# Patient Record
Sex: Male | Born: 1955 | Race: White | Hispanic: No | Marital: Married | Smoking: Never smoker
Health system: Southern US, Community
[De-identification: ages and names within clinical notes are randomized; demographics above are authoritative.]

## PROBLEM LIST (undated history)

## (undated) DIAGNOSIS — M5136 Other intervertebral disc degeneration, lumbar region: Secondary | ICD-10-CM

## (undated) DIAGNOSIS — I10 Essential (primary) hypertension: Secondary | ICD-10-CM

## (undated) DIAGNOSIS — K219 Gastro-esophageal reflux disease without esophagitis: Secondary | ICD-10-CM

## (undated) DIAGNOSIS — K900 Celiac disease: Secondary | ICD-10-CM

## (undated) DIAGNOSIS — M51369 Other intervertebral disc degeneration, lumbar region without mention of lumbar back pain or lower extremity pain: Secondary | ICD-10-CM

## (undated) HISTORY — PX: OTHER SURGICAL HISTORY: SHX169

## (undated) HISTORY — PX: ANTERIOR CERVICAL DECOMP/DISCECTOMY FUSION: SHX1161

## (undated) HISTORY — DX: Other intervertebral disc degeneration, lumbar region without mention of lumbar back pain or lower extremity pain: M51.369

## (undated) HISTORY — DX: Celiac disease: K90.0

## (undated) HISTORY — DX: Other intervertebral disc degeneration, lumbar region: M51.36

## (undated) HISTORY — DX: Essential (primary) hypertension: I10

## (undated) HISTORY — DX: Gastro-esophageal reflux disease without esophagitis: K21.9

## (undated) HISTORY — PX: APPENDECTOMY: SHX54

---

## 2011-06-03 ENCOUNTER — Ambulatory Visit: Payer: Self-pay | Admitting: Physician Assistant

## 2011-06-14 ENCOUNTER — Encounter: Payer: Self-pay | Admitting: Physician Assistant

## 2011-06-14 ENCOUNTER — Ambulatory Visit (INDEPENDENT_AMBULATORY_CARE_PROVIDER_SITE_OTHER): Payer: BC Managed Care – PPO | Admitting: Physician Assistant

## 2011-06-14 VITALS — BP 151/90 | HR 75 | Temp 98.3°F | Ht 74.0 in | Wt 291.0 lb

## 2011-06-14 DIAGNOSIS — F064 Anxiety disorder due to known physiological condition: Secondary | ICD-10-CM | POA: Insufficient documentation

## 2011-06-14 DIAGNOSIS — F068 Other specified mental disorders due to known physiological condition: Secondary | ICD-10-CM

## 2011-06-14 DIAGNOSIS — G8929 Other chronic pain: Secondary | ICD-10-CM | POA: Insufficient documentation

## 2011-06-14 DIAGNOSIS — Z7689 Persons encountering health services in other specified circumstances: Secondary | ICD-10-CM

## 2011-06-14 DIAGNOSIS — K219 Gastro-esophageal reflux disease without esophagitis: Secondary | ICD-10-CM | POA: Insufficient documentation

## 2011-06-14 DIAGNOSIS — M5136 Other intervertebral disc degeneration, lumbar region: Secondary | ICD-10-CM | POA: Insufficient documentation

## 2011-06-14 DIAGNOSIS — M5137 Other intervertebral disc degeneration, lumbosacral region: Secondary | ICD-10-CM

## 2011-06-14 DIAGNOSIS — M545 Low back pain: Secondary | ICD-10-CM

## 2011-06-14 DIAGNOSIS — K900 Celiac disease: Secondary | ICD-10-CM | POA: Insufficient documentation

## 2011-06-14 DIAGNOSIS — I10 Essential (primary) hypertension: Secondary | ICD-10-CM | POA: Insufficient documentation

## 2011-06-14 MED ORDER — LISINOPRIL 20 MG PO TABS: 20.0000 mg | ORAL_TABLET | Freq: Every day | ORAL | Status: DC

## 2011-06-14 MED ORDER — MELOXICAM 7.5 MG PO TABS: 7.5000 mg | ORAL_TABLET | Freq: Every day | ORAL | Status: DC

## 2011-06-14 MED ORDER — OMEPRAZOLE 20 MG PO CPDR: 20.0000 mg | DELAYED_RELEASE_CAPSULE | Freq: Every day | ORAL | Status: DC

## 2011-06-14 MED ORDER — ESCITALOPRAM OXALATE 20 MG PO TABS: 20.0000 mg | ORAL_TABLET | Freq: Every day | ORAL | Status: DC

## 2011-06-14 NOTE — Patient Instructions (Signed)
Will refer to pain management. Refilled all medications. Follow up as needed.

## 2011-06-14 NOTE — Progress Notes (Signed)
  Subjective:    Patient ID: Kenneth Duncan, male    DOB: Jan 12, 1956, 56 y.o.   MRN: [id number]  HPI Patient presents to clinic to establish care. Patient's history was reviewed together and documented. Patient is doing well today. They have moved here from [place] due to job change. 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 He also takes daily mobic and lexapro to manage pain and anxiety. He also has celiac disease and takes dapsone to control.   Review of Systems     Objective:   Physical Exam  Constitutional: He is oriented to person, place, and time. He appears well-developed and well-nourished.  HENT:  Head: Normocephalic and atraumatic.  Cardiovascular: Normal rate, regular rhythm and normal heart sounds.   Pulmonary/Chest: Effort normal and breath sounds normal.  Musculoskeletal: Normal range of motion.  Neurological: He is alert and oriented to person, place, and time.  Psychiatric: He has a normal mood and affect. His behavior is normal.          Assessment & Plan:  Hypertension- Refilled Lisinopril. Rechecked blood pressure 146/98. Patient reports that he has blood pressure cuff and will start check blood pressure more frequently. He states that believes his blood pressure was higher today than it normally is.   Low back pain and degenerative changes- Will refer to pain management for epidural injections and regulation of other pain medications. Mobic 7.83m refilled. Patient's pain is well controlled today.  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  GERD-Omeprazole refilled. Patient doing well on this dose.

## 2011-06-23 ENCOUNTER — Other Ambulatory Visit: Payer: Self-pay | Admitting: *Deleted

## 2011-06-23 MED ORDER — DAPSONE 100 MG PO TABS: 100.0000 mg | ORAL_TABLET | Freq: Two times a day (BID) | ORAL | Status: DC

## 2011-06-23 MED ORDER — OMEPRAZOLE 20 MG PO CPDR: 20.0000 mg | DELAYED_RELEASE_CAPSULE | Freq: Every day | ORAL | Status: DC

## 2011-08-24 ENCOUNTER — Encounter: Payer: Self-pay | Admitting: Physician Assistant

## 2011-08-26 DIAGNOSIS — Z0289 Encounter for other administrative examinations: Secondary | ICD-10-CM

## 2011-09-29 ENCOUNTER — Telehealth: Payer: Self-pay | Admitting: *Deleted

## 2011-09-29 MED ORDER — LORATADINE 10 MG PO TABS: 10.0000 mg | ORAL_TABLET | Freq: Every day | ORAL | Status: DC

## 2011-09-29 NOTE — Telephone Encounter (Signed)
Yes, ok for laratidine 74m QD, #90 with 1 RF.

## 2011-09-29 NOTE — Telephone Encounter (Signed)
Medication sent to pharmacy  

## 2011-09-29 NOTE — Telephone Encounter (Signed)
Wife states husband is having some watery eyes and allergy issues and would like to know if we can send Loratidine for him to pharmacy like Luvenia Starch did for her. Please advise.

## 2011-11-21 ENCOUNTER — Ambulatory Visit: Payer: BC Managed Care – PPO | Admitting: Physician Assistant

## 2011-11-21 ENCOUNTER — Ambulatory Visit (INDEPENDENT_AMBULATORY_CARE_PROVIDER_SITE_OTHER): Payer: BC Managed Care – PPO | Admitting: Physician Assistant

## 2011-11-21 ENCOUNTER — Encounter: Payer: Self-pay | Admitting: Physician Assistant

## 2011-11-21 VITALS — BP 124/74 | HR 69 | Ht 74.0 in | Wt 289.0 lb

## 2011-11-21 DIAGNOSIS — R5383 Other fatigue: Secondary | ICD-10-CM

## 2011-11-21 DIAGNOSIS — B880 Other acariasis: Secondary | ICD-10-CM

## 2011-11-21 DIAGNOSIS — Z79899 Other long term (current) drug therapy: Secondary | ICD-10-CM

## 2011-11-21 DIAGNOSIS — R5381 Other malaise: Secondary | ICD-10-CM

## 2011-11-21 DIAGNOSIS — L130 Dermatitis herpetiformis: Secondary | ICD-10-CM

## 2011-11-21 DIAGNOSIS — Z8639 Personal history of other endocrine, nutritional and metabolic disease: Secondary | ICD-10-CM

## 2011-11-21 MED ORDER — TRIAMCINOLONE ACETONIDE 0.1 % EX CREA: TOPICAL_CREAM | Freq: Two times a day (BID) | CUTANEOUS | Status: DC

## 2011-11-21 MED ORDER — MUPIROCIN 2 % EX OINT: TOPICAL_OINTMENT | Freq: Two times a day (BID) | CUTANEOUS | Status: AC

## 2011-11-21 NOTE — Patient Instructions (Addendum)
Will give triamcinolone(Kenalog) cream twice a day for leg up two weeks.   Bactroban to use twice a day to use on head. Follow up in 2-3.

## 2011-11-22 LAB — CBC WITH DIFFERENTIAL/PLATELET
Basophils Absolute: 0 10*3/uL (ref 0.0–0.1)
Basophils Relative: 0 % (ref 0–1)
Hemoglobin: 12.8 g/dL — ABNORMAL LOW (ref 13.0–17.0)
MCHC: 31.7 g/dL (ref 30.0–36.0)
Neutro Abs: 5.9 10*3/uL (ref 1.7–7.7)
Neutrophils Relative %: 65 % (ref 43–77)
Platelets: 167 10*3/uL (ref 150–400)
RDW: 14.7 % (ref 11.5–15.5)

## 2011-11-22 LAB — VITAMIN B12: Vitamin B-12: 321 pg/mL (ref 211–911)

## 2011-11-22 NOTE — Progress Notes (Signed)
  Subjective:    Patient ID: Kenneth Duncan, male    DOB: Aug 13, 1955, 56 y.o.   MRN: [id number]  HPI Pt presents to clinic with rash on legs that started about 1 month ago. The rash started around right ankle and then moved up right leg. Rash is very itchy. He has tried nothing to make better and nothing makes worse. They do seem to look like they are getting better. Denies any fever, chills, burning, SOB. He does have Celiac disease and has dermatitis herpeticform as a result he does not feel rash on his lef is from celiac disease. He has not been strict on diet lately and could have injested some gluten. He also has a rash on his scalp that is also very itchy for the last month. Has not put anything on those lesion either. He does state that about 2 weeks ago did feel like scalp lesions were draining.   He also has started feeling more tired lately. He has a history of b12 deficiency and used to take b12 shots every other month. He has not had shots in many months.    Review of Systems     Objective:   Physical Exam  Constitutional: He is oriented to person, place, and time. He appears well-developed and well-nourished.  HENT:       Numerous crusted and some open lesion on the scalp that are erythematous. No active drainage but a hx of drainage.   Cardiovascular: Normal rate and normal heart sounds.   Pulmonary/Chest: Effort normal and breath sounds normal. He has no wheezes.  Neurological: He is alert and oriented to person, place, and time.  Skin: Skin is warm and dry.       Numerous macules with central pinpoint scabs and erythematous base on front of right leg and around ankle.  Psychiatric: He has a normal mood and affect. His behavior is normal.          Assessment & Plan:  Chiggers- Discussed what Chiggers are. Gave triamcinolone to use BID for 2 weeks. Call if not improving. Encouraged patient to use bug spray when working out doors.   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 Some lesions on scalp look infected so gave Bactroban BID for 2 weeks. If lesions do not improve or resolve we can biopsy to see exactly what lesions are.   Tired/hx of b12 defiency- will get CBC and b12 levels. May need to start b12 shots again. He was not able to take oral b12.

## 2011-11-23 ENCOUNTER — Other Ambulatory Visit: Payer: Self-pay | Admitting: Physician Assistant

## 2011-11-23 MED ORDER — CYANOCOBALAMIN 1000 MCG/ML IJ SOLN: 1000.0000 ug | Freq: Once | INTRAMUSCULAR | Status: AC

## 2011-12-06 ENCOUNTER — Other Ambulatory Visit: Payer: Self-pay | Admitting: *Deleted

## 2011-12-06 MED ORDER — LISINOPRIL 20 MG PO TABS: 20.0000 mg | ORAL_TABLET | Freq: Every day | ORAL | Status: DC

## 2011-12-06 MED ORDER — OMEPRAZOLE 20 MG PO CPDR: 20.0000 mg | DELAYED_RELEASE_CAPSULE | Freq: Every day | ORAL | Status: DC

## 2011-12-06 MED ORDER — ESCITALOPRAM OXALATE 20 MG PO TABS: 20.0000 mg | ORAL_TABLET | Freq: Every day | ORAL | Status: DC

## 2011-12-08 ENCOUNTER — Other Ambulatory Visit: Payer: Self-pay | Admitting: Physician Assistant

## 2012-03-14 ENCOUNTER — Other Ambulatory Visit: Payer: Self-pay | Admitting: Physician Assistant

## 2012-03-26 ENCOUNTER — Other Ambulatory Visit: Payer: Self-pay | Admitting: *Deleted

## 2012-03-26 MED ORDER — OMEPRAZOLE 20 MG PO CPDR: 20.0000 mg | DELAYED_RELEASE_CAPSULE | Freq: Every day | ORAL | Status: DC

## 2012-04-23 ENCOUNTER — Other Ambulatory Visit: Payer: Self-pay

## 2012-04-23 MED ORDER — DAPSONE 100 MG PO TABS: 100.0000 mg | ORAL_TABLET | Freq: Two times a day (BID) | ORAL | Status: DC

## 2012-04-23 MED ORDER — LISINOPRIL 20 MG PO TABS: 20.0000 mg | ORAL_TABLET | Freq: Every day | ORAL | Status: DC

## 2012-07-19 ENCOUNTER — Other Ambulatory Visit: Payer: Self-pay | Admitting: Family Medicine

## 2012-07-27 ENCOUNTER — Ambulatory Visit (INDEPENDENT_AMBULATORY_CARE_PROVIDER_SITE_OTHER): Payer: BC Managed Care – PPO | Admitting: Physician Assistant

## 2012-07-27 ENCOUNTER — Encounter: Payer: Self-pay | Admitting: Physician Assistant

## 2012-07-27 VITALS — BP 121/85 | HR 84 | Wt 281.0 lb

## 2012-07-27 DIAGNOSIS — L130 Dermatitis herpetiformis: Secondary | ICD-10-CM

## 2012-07-27 DIAGNOSIS — K219 Gastro-esophageal reflux disease without esophagitis: Secondary | ICD-10-CM

## 2012-07-27 DIAGNOSIS — M51379 Other intervertebral disc degeneration, lumbosacral region without mention of lumbar back pain or lower extremity pain: Secondary | ICD-10-CM

## 2012-07-27 DIAGNOSIS — E538 Deficiency of other specified B group vitamins: Secondary | ICD-10-CM

## 2012-07-27 DIAGNOSIS — Z79899 Other long term (current) drug therapy: Secondary | ICD-10-CM

## 2012-07-27 DIAGNOSIS — M5137 Other intervertebral disc degeneration, lumbosacral region: Secondary | ICD-10-CM

## 2012-07-27 DIAGNOSIS — Z13220 Encounter for screening for lipoid disorders: Secondary | ICD-10-CM

## 2012-07-27 DIAGNOSIS — I10 Essential (primary) hypertension: Secondary | ICD-10-CM

## 2012-07-27 DIAGNOSIS — M5136 Other intervertebral disc degeneration, lumbar region: Secondary | ICD-10-CM

## 2012-07-27 DIAGNOSIS — F064 Anxiety disorder due to known physiological condition: Secondary | ICD-10-CM

## 2012-07-27 DIAGNOSIS — Z131 Encounter for screening for diabetes mellitus: Secondary | ICD-10-CM

## 2012-07-27 MED ORDER — ESCITALOPRAM OXALATE 20 MG PO TABS: 20.0000 mg | ORAL_TABLET | Freq: Every day | ORAL | Status: DC

## 2012-07-27 MED ORDER — LISINOPRIL 20 MG PO TABS: 20.0000 mg | ORAL_TABLET | Freq: Every day | ORAL | Status: DC

## 2012-07-27 MED ORDER — TRIAMCINOLONE ACETONIDE 0.1 % EX CREA: TOPICAL_CREAM | Freq: Two times a day (BID) | CUTANEOUS | Status: DC

## 2012-07-27 MED ORDER — OMEPRAZOLE 20 MG PO CPDR: 20.0000 mg | DELAYED_RELEASE_CAPSULE | Freq: Every day | ORAL | Status: DC

## 2012-07-27 MED ORDER — CYANOCOBALAMIN 1000 MCG/ML IJ SOLN
1000.0000 ug | Freq: Once | INTRAMUSCULAR | Status: AC
  Administered 2012-07-27: 1000 ug via INTRAMUSCULAR

## 2012-07-27 MED ORDER — FLUTICASONE PROPIONATE 50 MCG/ACT NA SUSP: 1.0000 | Freq: Every day | NASAL | Status: DC

## 2012-07-27 MED ORDER — DAPSONE 100 MG PO TABS: 100.0000 mg | ORAL_TABLET | Freq: Two times a day (BID) | ORAL | Status: DC

## 2012-07-27 MED ORDER — MELOXICAM 7.5 MG PO TABS: 7.5000 mg | ORAL_TABLET | Freq: Every day | ORAL | Status: DC

## 2012-07-27 MED ORDER — LORATADINE 10 MG PO TABS: 10.0000 mg | ORAL_TABLET | Freq: Every day | ORAL | Status: DC

## 2012-07-28 DIAGNOSIS — E538 Deficiency of other specified B group vitamins: Secondary | ICD-10-CM | POA: Insufficient documentation

## 2012-07-28 DIAGNOSIS — L130 Dermatitis herpetiformis: Secondary | ICD-10-CM | POA: Insufficient documentation

## 2012-07-28 NOTE — Progress Notes (Signed)
  Subjective:    Patient ID: Kenneth Duncan, male    DOB: April 06, 1956, 57 y.o.   MRN: [id number]  HPI Patient presents to the clinic to get medication refills.   HTN- controlled taking lisinopril daily. Denies any CP, palpitations, HA's. Keeping a low salt diet he has lost 10lbs over the past year. He feels great.   DDD- Mobic as needed along with as needed injections by pain clinic. Controlled today.   Gerd- controlled with prilosec. Diet affects reflux to the better and worse.  Celiac disease- controlled dapsone helps with herpetiform dermatitis. He also uses kenolog cream as needed for any rash.    b12 deficency needs shot today. Normally gets by nurse at work but needs today.   Allergies- eyes continue to water and runny nose if he does not medication. Needs refill of claritin/flonase.  Depression- well controlled. Feels happy. Exercising. Work going well. lexapro daily.    Review of Systems  All other systems reviewed and are negative.       Objective:   Physical Exam  Constitutional: He is oriented to person, place, and time. He appears well-developed and well-nourished.  HENT:  Head: Normocephalic and atraumatic.  Eyes: Conjunctivae are normal.  Neck: Normal range of motion. Neck supple. No thyromegaly present.  Cardiovascular: Normal rate, regular rhythm and normal heart sounds.   Pulmonary/Chest: Effort normal and breath sounds normal. He has no wheezes.  Lymphadenopathy:    He has no cervical adenopathy.  Neurological: He is alert and oriented to person, place, and time.  Skin: Skin is warm and dry.  Small erythematous papules with scabs on top over both arms.   Psychiatric: He has a normal mood and affect. His behavior is normal.          Assessment & Plan:  HtN- refilled lisionpril. Reminded of low salt diet and exercise.   Depression/anxiety- Lexapro refilled.  GERD- refilled prilosec.  DDD- refilled mobic.   Allergies- refilled  flonase/claritin.  b12 deficency- gave B12 shot today.   Celiac disease/herpetic dermatitis- refilled dapsone and kenalog.   Needs fasting labs and to medication management check liver/kidney.

## 2012-07-31 ENCOUNTER — Other Ambulatory Visit: Payer: Self-pay | Admitting: *Deleted

## 2012-07-31 MED ORDER — FLUTICASONE PROPIONATE 50 MCG/ACT NA SUSP: 1.0000 | Freq: Every day | NASAL | Status: DC

## 2012-07-31 NOTE — Telephone Encounter (Signed)
[facility]

## 2012-09-19 ENCOUNTER — Other Ambulatory Visit: Payer: Self-pay | Admitting: Physician Assistant

## 2013-01-17 ENCOUNTER — Other Ambulatory Visit: Payer: Self-pay | Admitting: Physician Assistant

## 2013-02-19 ENCOUNTER — Other Ambulatory Visit: Payer: Self-pay | Admitting: *Deleted

## 2013-02-19 MED ORDER — ESCITALOPRAM OXALATE 20 MG PO TABS: 20.0000 mg | ORAL_TABLET | Freq: Every day | ORAL | Status: DC

## 2013-03-13 ENCOUNTER — Encounter: Payer: Self-pay | Admitting: Physician Assistant

## 2013-03-13 ENCOUNTER — Ambulatory Visit (INDEPENDENT_AMBULATORY_CARE_PROVIDER_SITE_OTHER): Payer: BC Managed Care – PPO | Admitting: Physician Assistant

## 2013-03-13 VITALS — BP 129/77 | HR 71 | Wt 293.0 lb

## 2013-03-13 DIAGNOSIS — L130 Dermatitis herpetiformis: Secondary | ICD-10-CM

## 2013-03-13 DIAGNOSIS — Q828 Other specified congenital malformations of skin: Secondary | ICD-10-CM

## 2013-03-13 DIAGNOSIS — L909 Atrophic disorder of skin, unspecified: Secondary | ICD-10-CM

## 2013-03-13 DIAGNOSIS — R5383 Other fatigue: Secondary | ICD-10-CM

## 2013-03-13 DIAGNOSIS — R05 Cough: Secondary | ICD-10-CM

## 2013-03-13 DIAGNOSIS — J209 Acute bronchitis, unspecified: Secondary | ICD-10-CM

## 2013-03-13 DIAGNOSIS — N529 Male erectile dysfunction, unspecified: Secondary | ICD-10-CM

## 2013-03-13 DIAGNOSIS — R5381 Other malaise: Secondary | ICD-10-CM

## 2013-03-13 MED ORDER — ESCITALOPRAM OXALATE 20 MG PO TABS: 20.0000 mg | ORAL_TABLET | Freq: Every day | ORAL | Status: DC

## 2013-03-13 MED ORDER — CYANOCOBALAMIN 1000 MCG/ML IJ SOLN: 1000.0000 ug | Freq: Once | INTRAMUSCULAR | Status: DC

## 2013-03-13 MED ORDER — AZITHROMYCIN 250 MG PO TABS: ORAL_TABLET | ORAL | Status: DC

## 2013-03-13 MED ORDER — SILDENAFIL CITRATE 50 MG PO TABS: 50.0000 mg | ORAL_TABLET | Freq: Every day | ORAL | Status: DC | PRN

## 2013-03-13 MED ORDER — HYDROCODONE-HOMATROPINE 5-1.5 MG/5ML PO SYRP: 5.0000 mL | ORAL_SOLUTION | Freq: Every evening | ORAL | Status: DC | PRN

## 2013-03-13 MED ORDER — TRIAMCINOLONE ACETONIDE 0.1 % EX CREA: TOPICAL_CREAM | Freq: Two times a day (BID) | CUTANEOUS | Status: DC

## 2013-03-13 NOTE — Patient Instructions (Signed)
Acute Bronchitis Bronchitis is inflammation of the airways that extend from the windpipe into the lungs (bronchi). The inflammation often causes mucus to develop. This leads to a cough, which is the most common symptom of bronchitis.  In acute bronchitis, the condition usually develops suddenly and goes away over time, usually in a couple weeks. Smoking, allergies, and asthma can make bronchitis worse. Repeated episodes of bronchitis may cause further lung problems.  CAUSES Acute bronchitis is most often caused by the same virus that causes a cold. The virus can spread from person to person (contagious).  SIGNS AND SYMPTOMS   Cough.   Fever.   Coughing up mucus.   Body aches.   Chest congestion.   Chills.   Shortness of breath.   Sore throat.  DIAGNOSIS  Acute bronchitis is usually diagnosed through a physical exam. Tests, such as chest X-rays, are sometimes done to rule out other conditions.  TREATMENT  Acute bronchitis usually goes away in a couple weeks. Often times, no medical treatment is necessary. Medicines are sometimes given for relief of fever or cough. Antibiotics are usually not needed but may be prescribed in certain situations. In some cases, an inhaler may be recommended to help reduce shortness of breath and control the cough. A cool mist vaporizer may also be used to help thin bronchial secretions and make it easier to clear the chest.  HOME CARE INSTRUCTIONS  Get plenty of rest.   Drink enough fluids to keep your urine clear or pale yellow (unless you have a medical condition that requires fluid restriction). Increasing fluids may help thin your secretions and will prevent dehydration.   Only take over-the-counter or prescription medicines as directed by your health care provider.   Avoid smoking and secondhand smoke. Exposure to cigarette smoke or irritating chemicals will make bronchitis worse. If you are a smoker, consider using nicotine gum or skin  patches to help control withdrawal symptoms. Quitting smoking will help your lungs heal faster.   Reduce the chances of another bout of acute bronchitis by washing your hands frequently, avoiding people with cold symptoms, and trying not to touch your hands to your mouth, nose, or eyes.   Follow up with your health care provider as directed.  SEEK MEDICAL CARE IF: Your symptoms do not improve after 1 week of treatment.  SEEK IMMEDIATE MEDICAL CARE IF:  You develop an increased fever or chills.   You have chest pain.   You have severe shortness of breath.  You have bloody sputum.   You develop dehydration.  You develop fainting.  You develop repeated vomiting.  You develop a severe headache. MAKE SURE YOU:   Understand these instructions.  Will watch your condition.  Will get help right away if you are not doing well or get worse. Document Released: 05/12/2004 Document Revised: 12/05/2012 Document Reviewed: 09/25/2012 [employer] Patient Information 2014 [employer].  

## 2013-03-13 NOTE — Progress Notes (Signed)
Subjective:    Patient ID: Kenneth Duncan, male    DOB: 01/20/1956, 57 y.o.   MRN: [id number]  HPI Patient is a 57 year old male who presents to the clinic with multiple concerns.  Patient would like skin tags removed today. They have bothered him for quite some time now. They get very irritated and call on clothes.   Patient has also had a lingering cough for the last 2 weeks. Cough has been mostly dry with little production. He denies any sinus pressure, ear pain, sore throat, shortness of breath or wheezing. He is trying many over-the-counter treatments such as Mucinex, Sudafed, NyQuil and nasal spray for couple days. He does feel like he is getting some better. He has coughed so much that his ribs ache. He denies any fever, chills nausea or vomiting.  Patient is also having erectile dysfunction. He is having problems getting it up and staying up. He denies any urinary symptoms. He denies any weak stream. He reports he did have a PSA done approximately 2 years ago and any digital rectal that was normal. 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 Patient has never tried anything for ED.  Anxiety-Lexapro is controlling his anxiety very well. He needs a refill today.  Dermatitis from celaic- controlled with And Triamcinolone. Patient Needs Refill of Triamcinolone Today.  B12 deficiency-patient needs refill on solution. He has not been doing every month and wonders if this could be contributing to his fatigue.     Review of Systems     Objective:   Physical Exam  Constitutional: He is oriented to person, place, and time. He appears well-developed and well-nourished.  HENT:  Head: Normocephalic and atraumatic.  Right Ear: External ear normal.  Left Ear: External ear normal.  Nose: Nose normal.  Mouth/Throat: Oropharynx is clear and moist.  Eyes: Conjunctivae are normal.  Neck: Normal range of motion. Neck supple.  Cardiovascular: Normal rate, regular rhythm and normal  heart sounds.   Pulmonary/Chest: Effort normal and breath sounds normal. He has no wheezes.  Dry cough on exam today.   Lymphadenopathy:    He has no cervical adenopathy.  Neurological: He is alert and oriented to person, place, and time.  Skin:  Multiple skin tags in bilateral axilla.   Psychiatric: He has a normal mood and affect. His behavior is normal.          Assessment & Plan:  accessory skin tags-   Skin Tag Removal Procedure Note  Pre-operative Diagnosis: Classic skin tags (acrochordon)  Post-operative Diagnosis: Classic skin tags (acrochordon)  Locations:bilateral  wrist and axilla.  Indications: irritation/getting caught in things  Anesthesia: ethyl chloride   Procedure Details  The risks (including bleeding and infection) and benefits of the procedure and Verbal informed consent obtained. Using sterile iris scissors, multiple skin tags were snipped off at their bases after cleansing with Betadine.  Bleeding was controlled by pressure.   Findings: Pathognomonic benign lesions  not sent for pathological exam.  Condition: Stable  Complications: none.  Plan: 1. Instructed to keep the wounds dry and covered for 24-48h and clean thereafter. 2. Warning signs of infection were reviewed.   3. Recommended that the patient use OTC acetaminophen as needed for pain.  4. Return as needed.  Acute bronchitis- patient reports to me some better and I do think this is a virus running its course. I did give Hycodan to use at bedtime. Encouraged patient to continue symptomatic care. I did  print off her prescription for azithromycin if he should return for worse over the holiday weekend.  Anxiety- Refilled Lexapro for 6 months today.  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  B12 deficiency-refilled B12 solution today.  erectile dysfunction- gave samples of viagra to try. Sent rx to pharmacy. Discussed he is not on any medications that could be causing.  Will check Testosterone. Pt sees urologist in past no BPH. Suggest CPe with prostate check in near future.

## 2013-03-15 LAB — TESTOSTERONE, FREE, TOTAL, SHBG
Sex Hormone Binding: 29 nmol/L (ref 13–71)
Testosterone, Free: 69.4 pg/mL (ref 47.0–244.0)

## 2013-03-18 ENCOUNTER — Telehealth: Payer: Self-pay | Admitting: *Deleted

## 2013-03-18 NOTE — Telephone Encounter (Signed)
Ok to send for 6 months Lisinopril/Prilosec. The only other cream I see that I have ever sent was bactroban. Is this what he wants? If not call pharmacy and have them send me all creams/gels so we can find out which one works.

## 2013-03-18 NOTE — Telephone Encounter (Signed)
Wife states wrong cream was called in to [facility]. States the cream you sent in was what he has at home that doesn't work but didn't know the name of the cream. Also requesting refill on Pril[facility]c and [facility] but he wasn't seen for HTN. Please advise.  Oscar La, LPN

## 2013-03-19 MED ORDER — MUPIROCIN 2 % EX OINT: TOPICAL_OINTMENT | Freq: Two times a day (BID) | CUTANEOUS | Status: DC

## 2013-03-19 MED ORDER — LISINOPRIL 20 MG PO TABS: ORAL_TABLET | ORAL | Status: DC

## 2013-03-19 MED ORDER — OMEPRAZOLE 20 MG PO CPDR: 20.0000 mg | DELAYED_RELEASE_CAPSULE | Freq: Every day | ORAL | Status: DC

## 2013-03-19 NOTE — Telephone Encounter (Signed)
Kenneth Duncan states we can send the Bactroban to [facility] for this pt. They are asking for a larger tube. What dose do we need to send.  Oscar La, LPN

## 2013-03-19 NOTE — Telephone Encounter (Signed)
[facility]

## 2013-04-19 ENCOUNTER — Other Ambulatory Visit: Payer: Self-pay | Admitting: *Deleted

## 2013-04-19 MED ORDER — OSELTAMIVIR PHOSPHATE 75 MG PO CAPS: 75.0000 mg | ORAL_CAPSULE | Freq: Two times a day (BID) | ORAL | Status: DC

## 2013-06-07 ENCOUNTER — Other Ambulatory Visit: Payer: Self-pay | Admitting: Physician Assistant

## 2013-08-05 ENCOUNTER — Ambulatory Visit (INDEPENDENT_AMBULATORY_CARE_PROVIDER_SITE_OTHER): Payer: BC Managed Care – PPO | Admitting: Family Medicine

## 2013-08-05 ENCOUNTER — Encounter: Payer: Self-pay | Admitting: Family Medicine

## 2013-08-05 VITALS — BP 136/96 | HR 93 | Wt 289.0 lb

## 2013-08-05 DIAGNOSIS — F411 Generalized anxiety disorder: Secondary | ICD-10-CM

## 2013-08-05 DIAGNOSIS — K644 Residual hemorrhoidal skin tags: Secondary | ICD-10-CM

## 2013-08-05 DIAGNOSIS — F419 Anxiety disorder, unspecified: Secondary | ICD-10-CM

## 2013-08-05 MED ORDER — HYDROCORTISONE ACE-PRAMOXINE 1-1 % RE FOAM: 1.0000 | Freq: Two times a day (BID) | RECTAL | Status: DC

## 2013-08-05 MED ORDER — ESCITALOPRAM OXALATE 20 MG PO TABS: 20.0000 mg | ORAL_TABLET | Freq: Every day | ORAL | Status: DC

## 2013-08-05 NOTE — Progress Notes (Signed)
CC: Kenneth Duncan is a 58 y.o. male is here for Hemorrhoids   Subjective: HPI:  Complains of rectal itching and mild swelling has been present for the last 4 weeks comes and goes on a daily basis. Feels like prior episodes with hemorrhoids. No interventions as of yet.  Has not been accompanied by any constipation or diarrhea nor any rectal bleeding or melena. Symptoms are improved with compressing the mass with his finger however this resolves symptoms only for matter of seconds. Denies rectal pain. Symptoms overall are mild in severity.  Requesting refills on Lexapro. He continues to take this on a nightly basis for anxiety that had been interfering with his sleep and quality of life. He states that he has no anxiety in his life right now nor difficulty sleeping. Denies thoughts of wanting to harm himself or others recently or remotely   Review Of Systems Outlined In HPI  Past Medical History  Diagnosis Date  . Hypertension   . Celiac disease   . Degenerative disc disease, lumbar   . GERD (gastroesophageal reflux disease)     Past Surgical History  Procedure Laterality Date  . Appendectomy    . Anterior cervical decomp/discectomy fusion    . Knee arthoscopic surgery       Bilateral menicus tear  . Clavicle      distal end removed.   Family History  Problem Relation Age of Onset  . Uterine cancer Mother   . Heart attack Mother 41  . Heart attack Father   . Diabetes Father   . Heart disease Father   . Diabetes Sister   . Heart disease Sister   . Heart attack Sister   . Lung cancer Brother   . Lymphoma Brother   . Uterine cancer Sister     History   Social History  . Marital Status: Married    Spouse Name: N/A    Number of Children: N/A  . Years of Education: N/A   Occupational History  . Not on file.   Social History Main Topics  . Smoking status: Never Smoker   . Smokeless tobacco: Not on file  . Alcohol Use: No  . Drug Use: No  . Sexual Activity: Yes   Partners: Female   Other Topics Concern  . Not on file   Social History Narrative  . No narrative on file     Objective: BP 136/96  Pulse 93  Wt 289 lb (131.09 kg)  General: Alert and Oriented, No Acute Distress HEENT: Pupils equal, round, reactive to light. Conjunctivae clear.  Moist membranes pharynx unremarkable Lungs: Clear and comfortable work of breathing Cardiac: Regular rate and rhythm. Normal S1/S2.  No murmurs, rubs, nor gallops.   Abdomen: Obese and soft Rectal: Single slightly inflamed nonthrombosed external hemorrhoid without evidence of bleeding right lateral aspect of rectum.  Mental Status: No depression, anxiety, nor agitation. Skin: Warm and dry.  Assessment & Plan: Kenneth Duncan was seen today for hemorrhoids.  Diagnoses and associated orders for this visit:  External prolapsed hemorrhoids - hydrocortisone-pramoxine (PROCTOFOAM HC) rectal foam; Place 1 applicator rectally 2 (two) times daily.  Anxiety - escitalopram (LEXAPRO) 20 MG tablet; Take 1 tablet (20 mg total) by mouth daily.    External hemorrhoid: Start psyllium powder 2 heaping teaspoons every evening for the next 3 weeks. Sitz baths for 15-20 minutes at a time 3-4 times a day as symptoms persist. Start ProctoFoam but he can stop this when symptoms have resolved. Anxiety: Controlled refilling  Lexapro   Return if symptoms worsen or fail to improve.

## 2013-08-06 ENCOUNTER — Other Ambulatory Visit: Payer: Self-pay | Admitting: Physician Assistant

## 2013-08-13 ENCOUNTER — Telehealth: Payer: Self-pay | Admitting: *Deleted

## 2013-08-13 DIAGNOSIS — K644 Residual hemorrhoidal skin tags: Secondary | ICD-10-CM

## 2013-08-13 NOTE — Telephone Encounter (Signed)
Urgent referral to GI specialist placed, please let me know if not contacted about this by late this week.

## 2013-08-13 NOTE — Telephone Encounter (Signed)
Wife calls and states that husbands hemorroids are not any better.  The proctofoam that he was given is not helping at all.  Wants to know if there is anything else he can do and maybe getting into see a Psychologist, sport and exercise. Clemetine Marker, LPN

## 2013-08-14 ENCOUNTER — Other Ambulatory Visit: Payer: Self-pay | Admitting: Physician Assistant

## 2013-08-14 ENCOUNTER — Telehealth: Payer: Self-pay | Admitting: *Deleted

## 2013-08-14 NOTE — Telephone Encounter (Signed)
Pt's wife called and states she was not happy with her husbands visit today because the doctor put a band around one hemrrhoid and told him to come back for the others. Wife wasn't happy that the doctor wanted him to have a colonoscopy on Friday. She states right now it is too exprensive. 6 months from now he may do it but not at this time.

## 2013-08-14 NOTE — Telephone Encounter (Signed)
I understand if cannot afford at this time. Hopefully you can discuss that you would like to get external hemorrhoids resolved before getting colonoscopy. If they do not understand then we most certainly can get a second opinion. Likely they only did one hemorrhoid to see results before they treated all of them.

## 2013-08-14 NOTE — Telephone Encounter (Addendum)
Wife notified and pt is already on his way to appt

## 2013-08-15 ENCOUNTER — Encounter: Payer: Self-pay | Admitting: Family Medicine

## 2013-08-15 DIAGNOSIS — K648 Other hemorrhoids: Secondary | ICD-10-CM | POA: Insufficient documentation

## 2013-08-15 NOTE — Telephone Encounter (Signed)
It was discussed with them at the time of the appt and the doctor wanted to schedule one since he hadnt had one. i told the pt the wife that that was their choice to not have colonoscopy done until they could financially better afford it

## 2013-11-13 ENCOUNTER — Other Ambulatory Visit: Payer: Self-pay | Admitting: Physician Assistant

## 2014-02-04 ENCOUNTER — Other Ambulatory Visit: Payer: Self-pay | Admitting: Physician Assistant

## 2014-02-11 ENCOUNTER — Other Ambulatory Visit: Payer: Self-pay | Admitting: *Deleted

## 2014-02-11 MED ORDER — LISINOPRIL 20 MG PO TABS: ORAL_TABLET | ORAL | Status: DC

## 2014-02-28 ENCOUNTER — Ambulatory Visit (INDEPENDENT_AMBULATORY_CARE_PROVIDER_SITE_OTHER): Payer: BC Managed Care – PPO | Admitting: Physician Assistant

## 2014-02-28 ENCOUNTER — Encounter: Payer: Self-pay | Admitting: Physician Assistant

## 2014-02-28 VITALS — BP 125/71 | HR 87 | Ht 74.0 in | Wt 296.0 lb

## 2014-02-28 DIAGNOSIS — R5383 Other fatigue: Secondary | ICD-10-CM | POA: Diagnosis not present

## 2014-02-28 DIAGNOSIS — L130 Dermatitis herpetiformis: Secondary | ICD-10-CM | POA: Diagnosis not present

## 2014-02-28 DIAGNOSIS — I10 Essential (primary) hypertension: Secondary | ICD-10-CM

## 2014-02-28 DIAGNOSIS — F419 Anxiety disorder, unspecified: Secondary | ICD-10-CM

## 2014-02-28 MED ORDER — DAPSONE 100 MG PO TABS: ORAL_TABLET | ORAL | Status: DC

## 2014-02-28 MED ORDER — FLUTICASONE PROPIONATE 50 MCG/ACT NA SUSP: NASAL | Status: DC

## 2014-02-28 MED ORDER — ESCITALOPRAM OXALATE 20 MG PO TABS: 20.0000 mg | ORAL_TABLET | Freq: Every day | ORAL | Status: DC

## 2014-02-28 MED ORDER — OMEPRAZOLE 20 MG PO CPDR: 20.0000 mg | DELAYED_RELEASE_CAPSULE | Freq: Every day | ORAL | Status: DC

## 2014-02-28 MED ORDER — LISINOPRIL 20 MG PO TABS: ORAL_TABLET | ORAL | Status: DC

## 2014-03-02 NOTE — Progress Notes (Signed)
   Subjective:    Patient ID: Kenneth Duncan, male    DOB: [birth year], 58 y.o.   MRN: [id number]  HPI Pt presents to the clinic for 6 month follow up and medication refill.   HTN- doing well. No CP, palpitations, headaches, or vision changes.   Anxiety- doing well on lexapro no concerns.   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   He has been feeling more tired. He works 10 hour days 5-6 days a week. He admits to a physical and mental difficultly at work. He goes to bed at 8 with no problem and wakes up at 5am.he feels rested but by the end of day very tried. He does snore.     Review of Systems  All other systems reviewed and are negative.      Objective:   Physical Exam  Constitutional: He is oriented to person, place, and time. He appears well-developed and well-nourished.  HENT:  Head: Normocephalic and atraumatic.  Cardiovascular: Normal rate, regular rhythm and normal heart sounds.   Pulmonary/Chest: Effort normal and breath sounds normal.  Neurological: He is alert and oriented to person, place, and time.  Skin: Skin is dry.  Psychiatric: He has a normal mood and affect. His behavior is normal.          Assessment & Plan:  HTN- refilled for 6 months.   Anxiety- refilled for 6 months.   Herpetiform dermaitis- refilled for 6 months.   Tiredness/fatigue- will check testosterone, cbc, TSH, vitamin D and B12. Discussed possibility of sleep apnea and needs testing. Pt would like to start with blood work. Certainly discussed hours of work certainly could be causing some of [place]. Pt is overweight. Certainly losing some weight could help with tiredness.

## 2014-05-23 ENCOUNTER — Encounter: Payer: Self-pay | Admitting: Physician Assistant

## 2014-05-23 ENCOUNTER — Ambulatory Visit (INDEPENDENT_AMBULATORY_CARE_PROVIDER_SITE_OTHER): Payer: BLUE CROSS/BLUE SHIELD | Admitting: Physician Assistant

## 2014-05-23 VITALS — BP 109/67 | HR 73 | Temp 98.1°F | Ht 74.0 in | Wt 292.0 lb

## 2014-05-23 DIAGNOSIS — A499 Bacterial infection, unspecified: Secondary | ICD-10-CM

## 2014-05-23 DIAGNOSIS — J209 Acute bronchitis, unspecified: Secondary | ICD-10-CM | POA: Diagnosis not present

## 2014-05-23 DIAGNOSIS — B9689 Other specified bacterial agents as the cause of diseases classified elsewhere: Secondary | ICD-10-CM

## 2014-05-23 DIAGNOSIS — J329 Chronic sinusitis, unspecified: Secondary | ICD-10-CM

## 2014-05-23 MED ORDER — OMEPRAZOLE 20 MG PO CPDR: 20.0000 mg | DELAYED_RELEASE_CAPSULE | Freq: Every day | ORAL | Status: DC

## 2014-05-23 MED ORDER — ALBUTEROL SULFATE 108 (90 BASE) MCG/ACT IN AEPB: 2.0000 | INHALATION_SPRAY | RESPIRATORY_TRACT | Status: DC | PRN

## 2014-05-23 MED ORDER — METHYLPREDNISOLONE SODIUM SUCC 125 MG IJ SOLR
125.0000 mg | Freq: Once | INTRAMUSCULAR | Status: AC
  Administered 2014-05-23: 125 mg via INTRAMUSCULAR

## 2014-05-23 MED ORDER — IPRATROPIUM-ALBUTEROL 0.5-2.5 (3) MG/3ML IN SOLN
3.0000 mL | Freq: Once | RESPIRATORY_TRACT | Status: AC
  Administered 2014-05-23: 3 mL via RESPIRATORY_TRACT

## 2014-05-23 MED ORDER — HYDROCOD POLST-CHLORPHEN POLST 10-8 MG/5ML PO LQCR: 5.0000 mL | Freq: Two times a day (BID) | ORAL | Status: DC | PRN

## 2014-05-23 MED ORDER — AZITHROMYCIN 250 MG PO TABS: ORAL_TABLET | ORAL | Status: DC

## 2014-05-23 NOTE — Progress Notes (Signed)
   Subjective:    Patient ID: Kenneth Duncan, male    DOB: 20-Sep-1955, 59 y.o.   MRN: [id number]  HPI   Pt is a 59 yo patient who presents to the clinic with cough, chest congestion, nasal congestion and wheezing that is worse at night. Symptoms present for the last 2-3 weeks. Denies any fever or ear pain or ST. Using mucinex with no relief. Very faigued and worn out from coughing.    Review of Systems  All other systems reviewed and are negative.      Objective:   Physical Exam  Constitutional: He is oriented to person, place, and time. He appears well-developed and well-nourished.  HENT:  Head: Normocephalic and atraumatic.  Right Ear: External ear normal.  Left Ear: External ear normal.  Nose: Nose normal.  Mouth/Throat: Oropharynx is clear and moist.  Some tenderness over maxillary sinuses to palpation.  Nasal turbinates red and swollen.   Eyes: Conjunctivae are normal. Right eye exhibits no discharge. Left eye exhibits no discharge.  Neck: Normal range of motion. Neck supple.  Cardiovascular: Normal rate, regular rhythm and normal heart sounds.   Pulmonary/Chest: Effort normal and breath sounds normal. He has no wheezes.  Scattered coarse breath sounds. Cleared with cough.   Lymphadenopathy:    He has no cervical adenopathy.  Neurological: He is alert and oriented to person, place, and time.  Skin: Skin is dry.  Flushed cheeks.   Psychiatric: He has a normal mood and affect. His behavior is normal.          Assessment & Plan:  Acute bronchitis/bacterial sinusitis- duoneb given with some improvement per pt today. Solumedrol 174m IM given. Start on zpak. Continue mucinex. tussinonex only at bedtime. Albuterol inhaler given to use every 2-4 hours as needed for SOB/wheezing/cough. Follow up as needed.

## 2014-05-24 ENCOUNTER — Emergency Department (HOSPITAL_COMMUNITY)
Admission: EM | Admit: 2014-05-24 | Discharge: 2014-05-24 | Disposition: A | Discharge status: Home / Self Care | Payer: BLUE CROSS/BLUE SHIELD | Attending: Emergency Medicine | Admitting: Emergency Medicine

## 2014-05-24 ENCOUNTER — Encounter (HOSPITAL_COMMUNITY): Payer: Self-pay | Admitting: Emergency Medicine

## 2014-05-24 ENCOUNTER — Emergency Department (HOSPITAL_COMMUNITY): Payer: BLUE CROSS/BLUE SHIELD

## 2014-05-24 DIAGNOSIS — R61 Generalized hyperhidrosis: Secondary | ICD-10-CM | POA: Insufficient documentation

## 2014-05-24 DIAGNOSIS — R0602 Shortness of breath: Secondary | ICD-10-CM | POA: Diagnosis not present

## 2014-05-24 DIAGNOSIS — Z8739 Personal history of other diseases of the musculoskeletal system and connective tissue: Secondary | ICD-10-CM | POA: Diagnosis not present

## 2014-05-24 DIAGNOSIS — R531 Weakness: Secondary | ICD-10-CM | POA: Insufficient documentation

## 2014-05-24 DIAGNOSIS — F419 Anxiety disorder, unspecified: Secondary | ICD-10-CM | POA: Diagnosis not present

## 2014-05-24 DIAGNOSIS — R110 Nausea: Secondary | ICD-10-CM | POA: Diagnosis not present

## 2014-05-24 DIAGNOSIS — Z79899 Other long term (current) drug therapy: Secondary | ICD-10-CM | POA: Insufficient documentation

## 2014-05-24 DIAGNOSIS — R059 Cough, unspecified: Secondary | ICD-10-CM

## 2014-05-24 DIAGNOSIS — I10 Essential (primary) hypertension: Secondary | ICD-10-CM | POA: Diagnosis not present

## 2014-05-24 DIAGNOSIS — J029 Acute pharyngitis, unspecified: Secondary | ICD-10-CM | POA: Diagnosis not present

## 2014-05-24 DIAGNOSIS — R509 Fever, unspecified: Secondary | ICD-10-CM | POA: Insufficient documentation

## 2014-05-24 DIAGNOSIS — K219 Gastro-esophageal reflux disease without esophagitis: Secondary | ICD-10-CM | POA: Diagnosis not present

## 2014-05-24 DIAGNOSIS — Z7951 Long term (current) use of inhaled steroids: Secondary | ICD-10-CM | POA: Diagnosis not present

## 2014-05-24 DIAGNOSIS — R062 Wheezing: Secondary | ICD-10-CM | POA: Insufficient documentation

## 2014-05-24 DIAGNOSIS — R05 Cough: Secondary | ICD-10-CM | POA: Diagnosis not present

## 2014-05-24 DIAGNOSIS — Z880 Allergy status to penicillin: Secondary | ICD-10-CM | POA: Diagnosis not present

## 2014-05-24 LAB — CBC WITH DIFFERENTIAL/PLATELET
BASOS ABS: 0 10*3/uL (ref 0.0–0.1)
Basophils Relative: 0 % (ref 0–1)
EOS ABS: 0 10*3/uL (ref 0.0–0.7)
Eosinophils Relative: 0 % (ref 0–5)
HCT: 40.1 % (ref 39.0–52.0)
HEMOGLOBIN: 12.6 g/dL — AB (ref 13.0–17.0)
LYMPHS PCT: 8 % — AB (ref 12–46)
Lymphs Abs: 0.5 10*3/uL — ABNORMAL LOW (ref 0.7–4.0)
MCH: 29.1 pg (ref 26.0–34.0)
MCHC: 31.4 g/dL (ref 30.0–36.0)
MCV: 92.6 fL (ref 78.0–100.0)
MONOS PCT: 4 % (ref 3–12)
Monocytes Absolute: 0.3 10*3/uL (ref 0.1–1.0)
NEUTROS ABS: 5.7 10*3/uL (ref 1.7–7.7)
Neutrophils Relative %: 88 % — ABNORMAL HIGH (ref 43–77)
PLATELETS: 168 10*3/uL (ref 150–400)
RBC: 4.33 MIL/uL (ref 4.22–5.81)
RDW: 14.6 % (ref 11.5–15.5)
WBC: 6.5 10*3/uL (ref 4.0–10.5)

## 2014-05-24 LAB — COMPREHENSIVE METABOLIC PANEL
ALT: 43 U/L (ref 0–53)
AST: 41 U/L — ABNORMAL HIGH (ref 0–37)
Albumin: 3.7 g/dL (ref 3.5–5.2)
Alkaline Phosphatase: 84 U/L (ref 39–117)
Anion gap: 11 (ref 5–15)
BILIRUBIN TOTAL: 0.6 mg/dL (ref 0.3–1.2)
BUN: 11 mg/dL (ref 6–23)
CO2: 21 mmol/L (ref 19–32)
Calcium: 8.8 mg/dL (ref 8.4–10.5)
Chloride: 104 mmol/L (ref 96–112)
Creatinine, Ser: 1.03 mg/dL (ref 0.50–1.35)
GFR calc Af Amer: 90 mL/min — ABNORMAL LOW (ref >90)
GFR, EST NON AFRICAN AMERICAN: 78 mL/min — AB (ref >90)
Glucose, Bld: 178 mg/dL — ABNORMAL HIGH (ref 70–99)
Potassium: 3.8 mmol/L (ref 3.5–5.1)
SODIUM: 136 mmol/L (ref 135–145)
TOTAL PROTEIN: 6.9 g/dL (ref 6.0–8.3)

## 2014-05-24 MED ORDER — SODIUM CHLORIDE 0.9 % IV BOLUS (SEPSIS)
1000.0000 mL | Freq: Once | INTRAVENOUS | Status: AC
  Administered 2014-05-24: 1000 mL via INTRAVENOUS

## 2014-05-24 MED ORDER — ONDANSETRON HCL 4 MG/2ML IJ SOLN
4.0000 mg | Freq: Once | INTRAMUSCULAR | Status: DC
  Filled 2014-05-24: qty 2

## 2014-05-24 MED ORDER — ALBUTEROL SULFATE HFA 108 (90 BASE) MCG/ACT IN AERS
2.0000 | INHALATION_SPRAY | Freq: Once | RESPIRATORY_TRACT | Status: AC
  Administered 2014-05-24: 2 via RESPIRATORY_TRACT
  Filled 2014-05-24: qty 6.7

## 2014-05-24 NOTE — ED Notes (Signed)
Pt transported to radiology.

## 2014-05-24 NOTE — Discharge Instructions (Signed)
Cough, Adult  A cough is a reflex that helps clear your throat and airways. It can help heal the body or may be a reaction to an irritated airway. A cough may only last 2 or 3 weeks (acute) or may last more than 8 weeks (chronic).  CAUSES Acute cough:  Viral or bacterial infections. Chronic cough:  Infections.  Allergies.  Asthma.  Post-nasal drip.  Smoking.  Heartburn or acid reflux.  Some medicines.  Chronic lung problems (COPD).  Cancer. SYMPTOMS   Cough.  Fever.  Chest pain.  Increased breathing rate.  High-pitched whistling sound when breathing (wheezing).  Colored mucus that you cough up (sputum). TREATMENT   A bacterial cough may be treated with antibiotic medicine.  A viral cough must run its course and will not respond to antibiotics.  Your caregiver may recommend other treatments if you have a chronic cough. HOME CARE INSTRUCTIONS   Only take over-the-counter or prescription medicines for pain, discomfort, or fever as directed by your caregiver. Use cough suppressants only as directed by your caregiver.  Use a cold steam vaporizer or humidifier in your bedroom or home to help loosen secretions.  Sleep in a semi-upright position if your cough is worse at night.  Rest as needed.  Stop smoking if you smoke. SEEK IMMEDIATE MEDICAL CARE IF:   You have pus in your sputum.  Your cough starts to worsen.  You cannot control your cough with suppressants and are losing sleep.  You begin coughing up blood.  You have difficulty breathing.  You develop pain which is getting worse or is uncontrolled with medicine.  You have a fever. MAKE SURE YOU:   Understand these instructions.  Will watch your condition.  Will get help right away if you are not doing well or get worse. Document Released: 10/01/2010 Document Revised: 06/27/2011 Document Reviewed: 10/01/2010 [facility] Patient Information 2015 [place], [place]. This information is not intended  to replace advice given to you by your health care provider. Make sure you discuss any questions you have with your health care provider.   Weakness Weakness is a lack of strength. It may be felt all over the body (generalized) or in one specific part of the body (focal). Some causes of weakness can be serious. You may need further medical evaluation, especially if you are elderly or you have a history of immunosuppression (such as chemotherapy or HIV), kidney disease, heart disease, or diabetes. CAUSES  Weakness can be caused by many different things, including:  Infection.  Physical exhaustion.  Internal bleeding or other blood loss that results in a lack of red blood cells (anemia).  Dehydration. This cause is more common in elderly people.  Side effects or electrolyte abnormalities from medicines, such as pain medicines or sedatives.  Emotional distress, anxiety, or depression.  Circulation problems, especially severe peripheral arterial disease.  Heart disease, such as rapid atrial fibrillation, bradycardia, or heart failure.  Nervous system disorders, such as Guillain-Barr syndrome, multiple sclerosis, or stroke. DIAGNOSIS  To find the cause of your weakness, your caregiver will take your history and perform a physical exam. Lab tests or X-rays may also be ordered, if needed. TREATMENT  Treatment of weakness depends on the cause of your symptoms and can vary greatly. HOME CARE INSTRUCTIONS   Rest as needed.  Eat a well-balanced diet.  Try to get some exercise every day.  Only take over-the-counter or prescription medicines as directed by your caregiver. SEEK MEDICAL CARE IF:   Your weakness  seems to be getting worse or spreads to other parts of your body.  You develop new aches or pains. SEEK IMMEDIATE MEDICAL CARE IF:   You cannot perform your normal daily activities, such as getting dressed and feeding yourself.  You cannot walk up and down stairs, or you feel  exhausted when you do so.  You have shortness of breath or chest pain.  You have difficulty moving parts of your body.  You have weakness in only one area of the body or on only one side of the body.  You have a fever.  You have trouble speaking or swallowing.  You cannot control your bladder or bowel movements.  You have black or bloody vomit or stools. MAKE SURE YOU:  Understand these instructions.  Will watch your condition.  Will get help right away if you are not doing well or get worse. Document Released: 04/04/2005 Document Revised: 10/04/2011 Document Reviewed: 06/03/2011 [facility] Patient Information 2015 [place], [place]. This information is not intended to replace advice given to you by your health care provider. Make sure you discuss any questions you have with your health care provider.    Upper Respiratory Infection, Adult An upper respiratory infection (URI) is also sometimes known as the common cold. The upper respiratory tract includes the nose, sinuses, throat, trachea, and bronchi. Bronchi are the airways leading to the lungs. Most people improve within 1 week, but symptoms can last up to 2 weeks. A residual cough may last even longer.  CAUSES Many different viruses can infect the tissues lining the upper respiratory tract. The tissues become irritated and inflamed and often become very moist. Mucus production is also common. A cold is contagious. You can easily spread the virus to others by oral contact. This includes kissing, sharing a glass, coughing, or sneezing. Touching your mouth or nose and then touching a surface, which is then touched by another person, can also spread the virus. SYMPTOMS  Symptoms typically develop 1 to 3 days after you come in contact with a cold virus. Symptoms vary from person to person. They may include:  Runny nose.  Sneezing.  Nasal congestion.  Sinus irritation.  Sore throat.  Loss of voice  (laryngitis).  Cough.  Fatigue.  Muscle aches.  Loss of appetite.  Headache.  Low-grade fever. DIAGNOSIS  You might diagnose your own cold based on familiar symptoms, since most people get a cold 2 to 3 times a year. Your caregiver can confirm this based on your exam. Most importantly, your caregiver can check that your symptoms are not due to another disease such as strep throat, sinusitis, pneumonia, asthma, or epiglottitis. Blood tests, throat tests, and X-rays are not necessary to diagnose a common cold, but they may sometimes be helpful in excluding other more serious diseases. Your caregiver will decide if any further tests are required. RISKS AND COMPLICATIONS  You may be at risk for a more severe case of the common cold if you smoke cigarettes, have chronic heart disease (such as heart failure) or lung disease (such as asthma), or if you have a weakened immune system. The very young and very old are also at risk for more serious infections. Bacterial sinusitis, middle ear infections, and bacterial pneumonia can complicate the common cold. The common cold can worsen asthma and chronic obstructive pulmonary disease (COPD). Sometimes, these complications can require emergency medical care and may be life-threatening. PREVENTION  The best way to protect against getting a cold is to practice good hygiene.  Avoid oral or hand contact with people with cold symptoms. Wash your hands often if contact occurs. There is no clear evidence that vitamin C, vitamin E, echinacea, or exercise reduces the chance of developing a cold. However, it is always recommended to get plenty of rest and practice good nutrition. TREATMENT  Treatment is directed at relieving symptoms. There is no cure. Antibiotics are not effective, because the infection is caused by a virus, not by bacteria. Treatment may include:  Increased fluid intake. Sports drinks offer valuable electrolytes, sugars, and fluids.  Breathing  heated mist or steam (vaporizer or shower).  Eating chicken soup or other clear broths, and maintaining good nutrition.  Getting plenty of rest.  Using gargles or lozenges for comfort.  Controlling fevers with ibuprofen or acetaminophen as directed by your caregiver.  Increasing usage of your inhaler if you have asthma. Zinc gel and zinc lozenges, taken in the first 24 hours of the common cold, can shorten the duration and lessen the severity of symptoms. Pain medicines may help with fever, muscle aches, and throat pain. A variety of non-prescription medicines are available to treat congestion and runny nose. Your caregiver can make recommendations and may suggest nasal or lung inhalers for other symptoms.  HOME CARE INSTRUCTIONS   Only take over-the-counter or prescription medicines for pain, discomfort, or fever as directed by your caregiver.  Use a warm mist humidifier or inhale steam from a shower to increase air moisture. This may keep secretions moist and make it easier to breathe.  Drink enough water and fluids to keep your urine clear or pale yellow.  Rest as needed.  Return to work when your temperature has returned to normal or as your caregiver advises. You may need to stay home longer to avoid infecting others. You can also use a face mask and careful hand washing to prevent spread of the virus. SEEK MEDICAL CARE IF:   After the first few days, you feel you are getting worse rather than better.  You need your caregiver's advice about medicines to control symptoms.  You develop chills, worsening shortness of breath, or brown or red sputum. These may be signs of pneumonia.  You develop yellow or brown nasal discharge or pain in the face, especially when you bend forward. These may be signs of sinusitis.  You develop a fever, swollen neck glands, pain with swallowing, or white areas in the back of your throat. These may be signs of strep throat. SEEK IMMEDIATE MEDICAL CARE  IF:   You have a fever.  You develop severe or persistent headache, ear pain, sinus pain, or chest pain.  You develop wheezing, a prolonged cough, cough up blood, or have a change in your usual mucus (if you have chronic lung disease).  You develop sore muscles or a stiff neck. Document Released: 09/28/2000 Document Revised: 06/27/2011 Document Reviewed: 07/10/2013 [facility] Patient Information 2015 [place], [place]. This information is not intended to replace advice given to you by your health care provider. Make sure you discuss any questions you have with your health care provider.

## 2014-05-24 NOTE — ED Notes (Signed)
Pt. woke up this evening with anxiety attack , nausea , generalized weakness , seen by his PCP yesterday received steroid injection and prescribed with oral antibiotic /cough medication for URI , denies fever or chills.

## 2014-05-24 NOTE — ED Provider Notes (Signed)
CSN: [id number]     Arrival date & time 05/24/14  0031 History  This chart was scribed for Kenneth Hamburger, MD by Peyton Bottoms, ED Scribe. This patient was seen in room D35C/D35C and the patient's care was started at 1:08 AM.   Chief Complaint  Patient presents with  . Weakness  . Nausea  . Anxiety   Patient is a 59 y.o. male presenting with weakness, anxiety, and cough. The history is provided by the patient and the spouse. No language interpreter was used.  Weakness This is a new problem. The current episode started 12 to 24 hours ago. The problem occurs rarely. The problem has not changed since onset.Associated symptoms include shortness of breath. Pertinent negatives include no chest pain, no abdominal pain and no headaches. Nothing aggravates the symptoms. Nothing relieves the symptoms. He has tried nothing for the symptoms.  Anxiety Associated symptoms include shortness of breath. Pertinent negatives include no chest pain, no abdominal pain and no headaches.  Cough Cough characteristics:  Dry Severity:  Moderate Onset quality:  Gradual Duration:  3 weeks Timing:  Constant Progression:  Waxing and waning Smoker: no   Associated symptoms: chills, diaphoresis, fever, shortness of breath and sore throat   Associated symptoms: no chest pain and no headaches    HPI Comments: Kenneth Duncan is a 59 y.o. male with a PMHx of hypertension, Celiac disease, degenerative disc disease, GERD, appendectomy and anterior cervical decomp/discectomy fusion, who presents to the Emergency Department complaining of moderate weakness, nausea and anxiety that began this morning upon waking up. Per wife, patient has had dry cough, sore throat and sinus congestion for the past 3 weeks. Patient was seen earlier today by PCP and was treated for pneumonia symptoms. He was given Z-pak medication and steroid injection. Patient reports onset of nausea, diaphoresis, and chills that began about 15 hours ago since  administration of steroid injection. Patient reports associated subjective fever that also began earlier today. Patient states he had onset of shortness of breath upon waking up this morning which has resolved over the course of the day. He denies associated edema of legs bilaterally. Patient is a non-smoker and denies history of smoking in the past.  Past Medical History  Diagnosis Date  . Hypertension   . Celiac disease   . Degenerative disc disease, lumbar   . GERD (gastroesophageal reflux disease)    Past Surgical History  Procedure Laterality Date  . Appendectomy    . Anterior cervical decomp/discectomy fusion    . Knee arthoscopic surgery       Bilateral menicus tear  . Clavicle      distal end removed.   Family History  Problem Relation Age of Onset  . Uterine cancer Mother   . Heart attack Mother 75  . Heart attack Father   . Diabetes Father   . Heart disease Father   . Diabetes Sister   . Heart disease Sister   . Heart attack Sister   . Lung cancer Brother   . Lymphoma Brother   . Uterine cancer Sister    History  Substance Use Topics  . Smoking status: Never Smoker   . Smokeless tobacco: Not on file  . Alcohol Use: No   Review of Systems  Constitutional: Positive for fever, chills and diaphoresis.  HENT: Positive for congestion, sinus pressure and sore throat.   Respiratory: Positive for cough and shortness of breath.   Cardiovascular: Negative for chest pain.  Gastrointestinal: Positive for nausea.  Negative for abdominal pain.  Neurological: Positive for weakness. Negative for headaches.  All other systems reviewed and are negative.  Allergies  Penicillins  Home Medications   Prior to Admission medications   Medication Sig Start Date End Date Taking? Authorizing Provider  azithromycin (ZITHROMAX) 250 MG tablet Take 2 tablets now and then one tablet for 4 days. 05/23/14  Yes Jade L Breeback, PA-C  dapsone 100 MG tablet TAKE 1 TABLET TWICE A DAY Patient  taking differently: Take 100 mg by mouth daily.  02/28/14  Yes Hali Marry, MD  escitalopram (LEXAPRO) 20 MG tablet Take 1 tablet (20 mg total) by mouth daily. 02/28/14  Yes Hali Marry, MD  fluticasone (FLONASE) 50 MCG/ACT nasal spray Use one spray in each nostril one time daily Patient taking differently: Place 1 spray into both nostrils daily as needed for allergies.  02/28/14  Yes Hali Marry, MD  lisinopril (PRINIVIL,ZESTRIL) 20 MG tablet TAKE 1 TABLET DAILY (NEED FOLLOW UP APPOINTMENT BEFORE ADDITIONAL REFILLS ARE GIVEN) 02/28/14  Yes Hali Marry, MD  omeprazole (PRILOSEC) 20 MG capsule Take 1 capsule (20 mg total) by mouth daily. 05/23/14  Yes Jade L Breeback, PA-C  Albuterol Sulfate (PROAIR RESPICLICK) 503 (90 BASE) MCG/ACT AEPB Inhale 2 puffs into the lungs every 4 (four) hours as needed. Patient not taking: Reported on 05/24/2014 05/23/14   Donella Stade, PA-C  chlorpheniramine-HYDROcodone (TUSSIONEX) 10-8 MG/5ML LQCR Take 5 mLs by mouth every 12 (twelve) hours as needed for cough (cough, will cause drowsiness.). Patient not taking: Reported on 05/24/2014 05/23/14   Donella Stade, PA-C  cyanocobalamin (,VITAMIN B-12,) 1000 MCG/ML injection Inject 1 mL (1,000 mcg total) into the muscle once. Patient not taking: Reported on 05/24/2014 03/13/13   Donella Stade, PA-C   Triage Vitals: BP 132/80 mmHg  Pulse 79  Temp(Src) 97.9 F (36.6 C) (Oral)  Resp 18  Ht 6' 2"  (1.88 m)  Wt 292 lb (132.45 kg)  BMI 37.47 kg/m2  SpO2 94%  Physical Exam  Constitutional: He is oriented to person, place, and time. He appears well-developed and well-nourished. No distress.  HENT:  Head: Normocephalic and atraumatic.  Right Ear: External ear normal.  Left Ear: External ear normal.  Mouth/Throat: Oropharynx is clear and moist.  Eyes: Right eye exhibits no discharge. Left eye exhibits no discharge.  Neck: Neck supple. No tracheal deviation present.  Cardiovascular: Normal  rate, regular rhythm and normal heart sounds.   Pulmonary/Chest: Effort normal. No tachypnea. No respiratory distress. He has wheezes.  Mild intermittent wheezing in lungs.  Musculoskeletal: Normal range of motion. He exhibits no edema.  No edema in legs noted.  Neurological: He is alert and oriented to person, place, and time.  Skin: Skin is warm and dry.  Psychiatric: He has a normal mood and affect. His behavior is normal.  Nursing note and vitals reviewed.  ED Course  Procedures (including critical care time)  DIAGNOSTIC STUDIES: Oxygen Saturation is 94% on RA, normal by my interpretation.    COORDINATION OF CARE: 1:11 AM- Discussed plans to order diagnostic CXR and lab work. Will give patient IV fluids. Pt advised of plan for treatment and pt agrees.  Labs Review Labs Reviewed  COMPREHENSIVE METABOLIC PANEL - Abnormal; Notable for the following:    Glucose, Bld 178 (*)    AST 41 (*)    GFR calc non Af Amer 78 (*)    GFR calc Af Amer 90 (*)    All other  components within normal limits  CBC WITH DIFFERENTIAL/PLATELET - Abnormal; Notable for the following:    Hemoglobin 12.6 (*)    Neutrophils Relative % 88 (*)    Lymphocytes Relative 8 (*)    Lymphs Abs 0.5 (*)    All other components within normal limits    Imaging Review Dg Chest 2 View  05/24/2014   CLINICAL DATA:  Woke up with Chest pain, weakness and nausea.  EXAM: CHEST  2 VIEW  COMPARISON:  None.  FINDINGS: The heart is mildly enlarged. There is tortuosity and calcification of the thoracic aorta. The lungs are clear except for streaky bibasilar atelectasis. No pleural effusions or pulmonary edema. The bony thorax is intact.  IMPRESSION: Streaky bibasilar atelectasis but no infiltrates or effusions.   Electronically Signed   By: Kalman Jewels M.D.   On: 05/24/2014 02:17     EKG Interpretation None     MDM   Final diagnoses:  Cough  Weakness    59 year old male with sweating, chills and overall weakness  twice tonight. This occurred after getting IM steroids and one dose of azithromycin. Possibly related to these meds, but patient feels much improved now and has no significant abnormalities on exam. Likely related to meds vs URI. Labwork unremarkable except stable anemia and mild hyperglycemia. D/C home with PCP f/u.  I personally performed the services described in this documentation, which was scribed in my presence. The recorded information has been reviewed and is accurate.   Kenneth Hamburger, MD 05/24/14 [phone]

## 2014-06-13 ENCOUNTER — Ambulatory Visit: Payer: BLUE CROSS/BLUE SHIELD | Admitting: Family Medicine

## 2014-07-21 ENCOUNTER — Telehealth: Payer: Self-pay | Admitting: Physician Assistant

## 2014-07-21 NOTE — Telephone Encounter (Signed)
Received fax for Pa on fluticasone nasal spray sent through cover my meds waiting on auth. - CF

## 2014-07-24 NOTE — Telephone Encounter (Signed)
Received fax from [place] stating patient does not need pa under new plan the other account is termed - CF

## 2014-09-12 ENCOUNTER — Other Ambulatory Visit: Payer: Self-pay | Admitting: Family Medicine

## 2014-10-27 ENCOUNTER — Other Ambulatory Visit: Payer: Self-pay | Admitting: Family Medicine

## 2014-10-30 ENCOUNTER — Other Ambulatory Visit: Payer: Self-pay | Admitting: *Deleted

## 2014-10-30 DIAGNOSIS — F419 Anxiety disorder, unspecified: Secondary | ICD-10-CM

## 2014-10-30 MED ORDER — ESCITALOPRAM OXALATE 20 MG PO TABS: 20.0000 mg | ORAL_TABLET | Freq: Every day | ORAL | Status: DC

## 2014-11-28 ENCOUNTER — Other Ambulatory Visit: Payer: Self-pay | Admitting: Family Medicine

## 2014-12-19 ENCOUNTER — Ambulatory Visit (INDEPENDENT_AMBULATORY_CARE_PROVIDER_SITE_OTHER): Payer: BLUE CROSS/BLUE SHIELD | Admitting: Physician Assistant

## 2014-12-19 ENCOUNTER — Encounter: Payer: Self-pay | Admitting: Physician Assistant

## 2014-12-19 VITALS — BP 129/81 | HR 73 | Ht 74.0 in | Wt 297.0 lb

## 2014-12-19 DIAGNOSIS — K900 Celiac disease: Secondary | ICD-10-CM | POA: Diagnosis not present

## 2014-12-19 DIAGNOSIS — L130 Dermatitis herpetiformis: Secondary | ICD-10-CM

## 2014-12-19 DIAGNOSIS — E538 Deficiency of other specified B group vitamins: Secondary | ICD-10-CM | POA: Diagnosis not present

## 2014-12-19 DIAGNOSIS — Z13220 Encounter for screening for lipoid disorders: Secondary | ICD-10-CM

## 2014-12-19 DIAGNOSIS — Z131 Encounter for screening for diabetes mellitus: Secondary | ICD-10-CM

## 2014-12-19 DIAGNOSIS — I10 Essential (primary) hypertension: Secondary | ICD-10-CM

## 2014-12-19 DIAGNOSIS — K219 Gastro-esophageal reflux disease without esophagitis: Secondary | ICD-10-CM

## 2014-12-19 DIAGNOSIS — F064 Anxiety disorder due to known physiological condition: Secondary | ICD-10-CM

## 2014-12-19 MED ORDER — CYANOCOBALAMIN 1000 MCG/ML IJ SOLN: 1000.0000 ug | Freq: Once | INTRAMUSCULAR | Status: DC

## 2014-12-19 MED ORDER — DAPSONE 100 MG PO TABS: ORAL_TABLET | ORAL | Status: DC

## 2014-12-19 MED ORDER — LISINOPRIL 20 MG PO TABS: ORAL_TABLET | ORAL | Status: DC

## 2014-12-19 MED ORDER — OMEPRAZOLE 20 MG PO CPDR: 20.0000 mg | DELAYED_RELEASE_CAPSULE | Freq: Every day | ORAL | Status: DC

## 2014-12-19 MED ORDER — ESCITALOPRAM OXALATE 20 MG PO TABS
20.0000 mg | ORAL_TABLET | Freq: Every day | ORAL | Status: DC
Start: 2014-12-19 — End: 2015-03-04

## 2014-12-19 MED ORDER — FLUTICASONE PROPIONATE 50 MCG/ACT NA SUSP: NASAL | Status: DC

## 2014-12-23 NOTE — Progress Notes (Signed)
   Subjective:    Patient ID: Kenneth Duncan, male    DOB: 1955-12-15, 59 y.o.   MRN: [id number]  HPI Pt presents to the clinic for medication refills.   B12- not been taking. Would like to start back. Has been more fatigued and wondered if this could be one cause.   Celiac/herpetiformis- controlled with dapsone and diet. Doesn't always keep to diet.   GERD- prilosec daily for control.   HTN- no CP, palptiations, SOB, HA or dizziness.   Anxiety- no problems or concerns. Doing well.    Review of Systems  All other systems reviewed and are negative.      Objective:   Physical Exam  Constitutional: He is oriented to person, place, and time. He appears well-developed and well-nourished.  HENT:  Head: Normocephalic and atraumatic.  Cardiovascular: Normal rate, regular rhythm and normal heart sounds.   Pulmonary/Chest: Effort normal and breath sounds normal.  Neurological: He is alert and oriented to person, place, and time.  Psychiatric: He has a normal mood and affect. His behavior is normal.          Assessment & Plan:  Celiac/herpeformins- fairly controlled with diet and dapsone. Refilled today.   b12 deficiency- started back on b12. Will check levels with labs.   GERD- refilled omeprazole.   Anxiety- refilled lexapro.   HtN- refilled lisinopril.   Pt needs to schedule a CPE. Fasting labs given.

## 2014-12-26 ENCOUNTER — Telehealth: Payer: Self-pay | Admitting: Physician Assistant

## 2014-12-26 NOTE — Telephone Encounter (Signed)
Received fax for prior authorization on Fluticasone sent through cover my meds waiting on authorization. - CF

## 2015-01-01 NOTE — Telephone Encounter (Signed)
Resubmitted pa through cover my meds waiting on authorization. - CF

## 2015-01-02 NOTE — Telephone Encounter (Signed)
[facility] ran under an old workers [employer] instead of his [place] which does not require a prior authorization they have corrected this and patient has medicine. - CF

## 2015-02-24 ENCOUNTER — Telehealth: Payer: Self-pay

## 2015-02-24 DIAGNOSIS — L989 Disorder of the skin and subcutaneous tissue, unspecified: Secondary | ICD-10-CM

## 2015-02-24 NOTE — Telephone Encounter (Signed)
Kenneth Duncan has an area on his right arm that he wants removed and believes it is so large he may need a dermatologist. Please advise.

## 2015-02-24 NOTE — Telephone Encounter (Signed)
[facility] for referral. Dr. Frederico Hamman in [place] is really good. [facility] dermatology.

## 2015-02-25 NOTE — Telephone Encounter (Signed)
Referral sent 

## 2015-03-04 ENCOUNTER — Other Ambulatory Visit: Payer: Self-pay | Admitting: Physician Assistant

## 2015-05-09 ENCOUNTER — Other Ambulatory Visit: Payer: Self-pay | Admitting: Physician Assistant

## 2015-06-04 ENCOUNTER — Other Ambulatory Visit: Payer: Self-pay | Admitting: Physician Assistant

## 2015-06-09 ENCOUNTER — Other Ambulatory Visit: Payer: Self-pay | Admitting: Physician Assistant

## 2015-06-23 ENCOUNTER — Encounter: Payer: Self-pay | Admitting: Family Medicine

## 2015-06-23 ENCOUNTER — Ambulatory Visit (INDEPENDENT_AMBULATORY_CARE_PROVIDER_SITE_OTHER): Payer: BLUE CROSS/BLUE SHIELD | Admitting: Family Medicine

## 2015-06-23 VITALS — BP 119/84 | HR 78 | Temp 99.0°F | Wt 286.0 lb

## 2015-06-23 DIAGNOSIS — R0681 Apnea, not elsewhere classified: Secondary | ICD-10-CM

## 2015-06-23 DIAGNOSIS — R05 Cough: Secondary | ICD-10-CM | POA: Diagnosis not present

## 2015-06-23 DIAGNOSIS — R059 Cough, unspecified: Secondary | ICD-10-CM

## 2015-06-23 MED ORDER — AZITHROMYCIN 250 MG PO TABS: ORAL_TABLET | ORAL | Status: AC

## 2015-06-23 NOTE — Progress Notes (Signed)
CC: Kenneth Duncan is a 61 y.o. male is here for No chief complaint on file.   Subjective: HPI:  productive cough with occasional wheezing  That has been going on for week and a half now. Felt like he was getting better over the weekend but now worsening. Accompanied by worsening snoring and fatigue. No benefit from over-the-counter cough and cold medications. He reports subjective fever and chills but no objective temperature measurement. Daughter at home is almost identical symptoms. Symptoms of moderate severity present all hours of day and worse at night  He's always been a snorer for the majority of the last decade. Symptoms were worsening on a monthly basis and were present even prior to his illness described above. His wife has noticed him experiences apneic episodes multiple times a night but he's never had a sleep study. He endorses excessive daytime fatigue even when he is in his normal state of health and not battling the infection above.   Review Of Systems Outlined In HPI  Past Medical History  Diagnosis Date  . Hypertension   . Celiac disease   . Degenerative disc disease, lumbar   . GERD (gastroesophageal reflux disease)     Past Surgical History  Procedure Laterality Date  . Appendectomy    . Anterior cervical decomp/discectomy fusion    . Knee arthoscopic surgery       Bilateral menicus tear  . Clavicle      distal end removed.   Family History  Problem Relation Age of Onset  . Uterine cancer Mother   . Heart attack Mother 75  . Heart attack Father   . Diabetes Father   . Heart disease Father   . Diabetes Sister   . Heart disease Sister   . Heart attack Sister   . Lung cancer Brother   . Lymphoma Brother   . Uterine cancer Sister     Social History   Social History  . Marital Status: Married    Spouse Name: N/A  . Number of Children: N/A  . Years of Education: N/A   Occupational History  . Not on file.   Social History Main Topics  . Smoking  status: Never Smoker   . Smokeless tobacco: Not on file  . Alcohol Use: No  . Drug Use: No  . Sexual Activity:    Partners: Female   Other Topics Concern  . Not on file   Social History Narrative     Objective: BP 119/84 mmHg  Pulse 78  Temp(Src) 99 F (37.2 C) (Oral)  Wt 286 lb (129.729 kg)  General: Alert and Oriented, No Acute Distress HEENT: Pupils equal, round, reactive to light. Conjunctivae clear.  External ears unremarkable, canals clear with intact TMs with appropriate landmarks.  Middle ear appears open without effusion. Pink inferior turbinates.  Moist mucous membranes, pharynx without inflammation nor lesions.  Neck supple without palpable lymphadenopathy nor abnormal masses. Lungs: central rhonchi in all lung fields. No wheezing or rales. Cardiac: Regular rate and rhythm. Normal S1/S2.  No murmurs, rubs, nor gallops.   Abdomen: moderately obese Extremities: No peripheral edema.  Strong peripheral pulses.  Mental Status: No depression, anxiety, nor agitation. Skin: Warm and dry.  Assessment & Plan: Diagnoses and all orders for this visit:  Cough -     azithromycin (ZITHROMAX) 250 MG tablet; Take two tabs at once on day 1, then one tab daily on days 2-5.  Apneic episode -     Home sleep test  Cough: Bronchitis, start Zithromax call if no better in 2 days next step would be prednisone. Apneic episodes and snoring: Chronic uncontrolled condition. There is insurance will pay for his very interested in getting a sleep study at home, he has strong reservations about not going to a sleep center for a sleep study.  Awaiting FMLA paperwork for missing Friday, Monday-Wed  Return if symptoms worsen or fail to improve.

## 2015-06-27 ENCOUNTER — Other Ambulatory Visit: Payer: Self-pay | Admitting: Physician Assistant

## 2015-06-29 ENCOUNTER — Telehealth: Payer: Self-pay

## 2015-06-29 MED ORDER — PREDNISONE 20 MG PO TABS: ORAL_TABLET | ORAL | Status: AC

## 2015-06-29 MED ORDER — PREDNISONE 20 MG PO TABS: ORAL_TABLET | ORAL | Status: DC

## 2015-06-29 NOTE — Telephone Encounter (Signed)
Wife called stating that her after finishing the z-pak husband is still having a cough.  Any other suggestions?

## 2015-06-29 NOTE — Telephone Encounter (Signed)
I'd recommend a 5 day course of prednisone that I've [facility] [place], [address]

## 2015-06-29 NOTE — Telephone Encounter (Signed)
Message left on vm with advice

## 2015-06-29 NOTE — Telephone Encounter (Signed)
Pt.notified

## 2015-06-30 ENCOUNTER — Other Ambulatory Visit: Payer: Self-pay | Admitting: *Deleted

## 2015-06-30 MED ORDER — LISINOPRIL 20 MG PO TABS: ORAL_TABLET | ORAL | Status: DC

## 2015-07-21 ENCOUNTER — Ambulatory Visit: Payer: BLUE CROSS/BLUE SHIELD | Admitting: Physician Assistant

## 2015-07-24 ENCOUNTER — Encounter: Payer: Self-pay | Admitting: Physician Assistant

## 2015-07-24 ENCOUNTER — Ambulatory Visit (INDEPENDENT_AMBULATORY_CARE_PROVIDER_SITE_OTHER): Payer: BLUE CROSS/BLUE SHIELD | Admitting: Physician Assistant

## 2015-07-24 VITALS — BP 147/82 | HR 80 | Temp 98.8°F | Wt 287.0 lb

## 2015-07-24 DIAGNOSIS — J45901 Unspecified asthma with (acute) exacerbation: Secondary | ICD-10-CM

## 2015-07-24 MED ORDER — PREDNISONE 20 MG PO TABS: ORAL_TABLET | ORAL | Status: DC

## 2015-07-24 MED ORDER — ALBUTEROL SULFATE HFA 108 (90 BASE) MCG/ACT IN AERS: 2.0000 | INHALATION_SPRAY | Freq: Four times a day (QID) | RESPIRATORY_TRACT | Status: DC | PRN

## 2015-07-24 MED ORDER — METHYLPREDNISOLONE SODIUM SUCC 125 MG IJ SOLR
125.0000 mg | Freq: Once | INTRAMUSCULAR | Status: AC
  Administered 2015-07-24: 125 mg via INTRAMUSCULAR

## 2015-07-24 MED ORDER — IPRATROPIUM-ALBUTEROL 0.5-2.5 (3) MG/3ML IN SOLN
3.0000 mL | Freq: Once | RESPIRATORY_TRACT | Status: AC
  Administered 2015-07-24: 3 mL via RESPIRATORY_TRACT

## 2015-07-24 MED ORDER — LEVOFLOXACIN 500 MG PO TABS: 500.0000 mg | ORAL_TABLET | Freq: Every day | ORAL | Status: DC

## 2015-07-24 MED ORDER — CYANOCOBALAMIN 1000 MCG/ML IJ SOLN: 1000.0000 ug | Freq: Once | INTRAMUSCULAR | Status: DC

## 2015-07-24 NOTE — Patient Instructions (Signed)

## 2015-07-25 ENCOUNTER — Other Ambulatory Visit: Payer: Self-pay | Admitting: Physician Assistant

## 2015-07-27 ENCOUNTER — Encounter: Payer: Self-pay | Admitting: Physician Assistant

## 2015-07-27 ENCOUNTER — Encounter (HOSPITAL_BASED_OUTPATIENT_CLINIC_OR_DEPARTMENT_OTHER): Payer: BLUE CROSS/BLUE SHIELD

## 2015-07-27 NOTE — Progress Notes (Signed)
   Subjective:    Patient ID: Kenneth Duncan, male    DOB: 04-06-1956, 60 y.o.   MRN: [id number]  HPI Pt is a 60 yo male who presents to the clinic with wife. He has a ongoing cough, SOB, and wheezing for last 2 weeks. He was seen by Dr. Ileene Rubens and given 5 days of prednisone. He got a little better then got worse. He can't stop coughing. His chest feels tight. He is using delsym which does help. No fever, chills, body aches.    Review of Systems See HPI.    Objective:   Physical Exam  Constitutional: He is oriented to person, place, and time. He appears well-developed and well-nourished.  HENT:  Head: Normocephalic and atraumatic.  Right Ear: External ear normal.  Left Ear: External ear normal.  Nose: Nose normal.  Mouth/Throat: Oropharynx is clear and moist. No oropharyngeal exudate.  Eyes: Conjunctivae are normal. Right eye exhibits no discharge. Left eye exhibits no discharge.  Neck: Normal range of motion. Neck supple.  Cardiovascular: Normal rate, regular rhythm and normal heart sounds.   Pulmonary/Chest:  Bilateral wheezing bilaterally. Improvement with duoneb.  No rhonchi or crackles.   Lymphadenopathy:    He has no cervical adenopathy.  Neurological: He is alert and oriented to person, place, and time.  Skin: Skin is dry.  Psychiatric: He has a normal mood and affect. His behavior is normal.          Assessment & Plan:  Asthmatic bronchitis with acute exacerbation- pulse ox 93 percent. duoneb given in office with significant improvement. Sent albuterol inhaler to use as needed every 2-4 hours. Solumedrol given in office. Prednisone taper to start 24 hours after injection. levaquin given for 7 days. Follow up as needed. HO given.

## 2015-07-29 ENCOUNTER — Telehealth: Payer: Self-pay

## 2015-07-29 NOTE — Telephone Encounter (Signed)
coriciden is safe for BP. flonase could also help.

## 2015-07-29 NOTE — Telephone Encounter (Signed)
Patient wife called and reports she is still congested and feels stuffed up. She would like to know what he can take for the congestion. He has high blood pressure and she is concerned about what he should take.

## 2015-07-29 NOTE — Telephone Encounter (Signed)
Patient's wife advised

## 2015-08-11 ENCOUNTER — Ambulatory Visit (HOSPITAL_BASED_OUTPATIENT_CLINIC_OR_DEPARTMENT_OTHER): Payer: BLUE CROSS/BLUE SHIELD | Attending: Family Medicine | Admitting: Internal Medicine

## 2015-08-11 VITALS — Ht 74.0 in

## 2015-08-11 DIAGNOSIS — G4710 Hypersomnia, unspecified: Secondary | ICD-10-CM

## 2015-08-11 DIAGNOSIS — G4733 Obstructive sleep apnea (adult) (pediatric): Secondary | ICD-10-CM | POA: Diagnosis not present

## 2015-08-11 DIAGNOSIS — G4736 Sleep related hypoventilation in conditions classified elsewhere: Secondary | ICD-10-CM | POA: Insufficient documentation

## 2015-08-11 DIAGNOSIS — R0683 Snoring: Secondary | ICD-10-CM

## 2015-08-11 DIAGNOSIS — G4730 Sleep apnea, unspecified: Secondary | ICD-10-CM

## 2015-08-11 DIAGNOSIS — R451 Restlessness and agitation: Secondary | ICD-10-CM

## 2015-08-22 DIAGNOSIS — R0683 Snoring: Secondary | ICD-10-CM | POA: Diagnosis not present

## 2015-08-22 DIAGNOSIS — G4730 Sleep apnea, unspecified: Secondary | ICD-10-CM

## 2015-08-22 DIAGNOSIS — R451 Restlessness and agitation: Secondary | ICD-10-CM

## 2015-08-22 DIAGNOSIS — G4710 Hypersomnia, unspecified: Secondary | ICD-10-CM | POA: Diagnosis not present

## 2015-08-22 NOTE — Procedures (Signed)
    Patient Name: Kenneth Duncan, Kenneth Duncan Date: 08/11/2015 Gender: Male D.O.B: Mar 02, 1956 Age (years): 60 Referring Provider: Marcial Pacas Height (inches): 72 Interpreting Physician: Baird Lyons MD, ABSM Weight (lbs): 290 RPSGT: Jacolyn Reedy BMI: 39 MRN: [id number] Neck Size: 18.00 CLINICAL INFORMATION Sleep Study Type: unattended Home Sleep Test   Indication for sleep study: OSA   Epworth Sleepiness Score: 5  SLEEP STUDY TECHNIQUE A multi-channel overnight portable sleep study was performed. The channels recorded were: nasal airflow, thoracic respiratory movement, and oxygen saturation with a pulse oximetry. Snoring was also monitored.  MEDICATIONS Patient self administered medications include: no meds reported taken during sleep study.  SLEEP ARCHITECTURE Patient was studied for 534.6 minutes. The sleep efficiency was 98.7 % and the patient was supine for 63.5%. The arousal index was 0.0 per hour.  RESPIRATORY PARAMETERS The overall AHI was 44.1 per hour, with a central apnea index of 0.0 per hour. The oxygen nadir was 74% during sleep.  CARDIAC DATA Mean heart rate during sleep was 58.2 bpm.  IMPRESSIONS - Severe obstructive sleep apnea occurred during this study (AHI = 44.1/h). - No significant central sleep apnea occurred during this study (CAI = 0.0/h). - Severe oxygen desaturation was noted during this study (Min O2 = 74%). - Patient snored   DIAGNOSIS - Obstructive Sleep Apnea (327.23 [G47.33 ICD-10]) - Nocturnal Hypoxemia (327.26 [G47.36 ICD-10])  RECOMMENDATIONS - CPAP titration for therapeutic presure. - Positional therapy avoiding supine position during sleep. - Avoid alcohol, sedatives and other CNS depressants that may worsen sleep apnea and disrupt normal sleep architecture. - Sleep hygiene should be reviewed to assess factors that may improve sleep quality. - Weight management and regular exercise should be initiated or  continued.  Deneise Lever Diplomate, American Board of Sleep Medicine  ELECTRONICALLY SIGNED ON:  08/22/2015, 3:30 PM [place] PH: [phone]   FX: [phone] [place]

## 2015-08-24 ENCOUNTER — Telehealth: Payer: Self-pay | Admitting: Family Medicine

## 2015-08-24 DIAGNOSIS — G4733 Obstructive sleep apnea (adult) (pediatric): Secondary | ICD-10-CM | POA: Insufficient documentation

## 2015-08-24 NOTE — Telephone Encounter (Signed)
Please call pt: he does have sleep apnea. Needs CPAP titration. Please order.

## 2015-08-24 NOTE — Telephone Encounter (Signed)
Will you please let patient know that his home sleep test revealed that he's experiencing sleep apnea and I would recommend that he have a cpap titration test done at one of our sleep centers.  Please let me know if not contacted by the [place] sleep center by the end of the week.

## 2015-08-24 NOTE — Telephone Encounter (Signed)
Pt.notified

## 2015-08-25 ENCOUNTER — Other Ambulatory Visit: Payer: Self-pay | Admitting: Physician Assistant

## 2015-08-25 ENCOUNTER — Telehealth: Payer: Self-pay | Admitting: Family Medicine

## 2015-08-25 DIAGNOSIS — G4733 Obstructive sleep apnea (adult) (pediatric): Secondary | ICD-10-CM

## 2015-08-25 NOTE — Telephone Encounter (Signed)
error 

## 2015-09-02 ENCOUNTER — Telehealth: Payer: Self-pay

## 2015-09-02 NOTE — Telephone Encounter (Signed)
Wife wants to know is there any other way for Mr. Kenneth Duncan to get the cpap machine without going through with the cpap titration?

## 2015-09-02 NOTE — Telephone Encounter (Signed)
Wife notified.

## 2015-09-02 NOTE — Telephone Encounter (Signed)
No because we need to know what pressure to start him at.

## 2015-09-02 NOTE — Telephone Encounter (Signed)
Ok he can call if he decides otherwise.

## 2015-09-02 NOTE — Telephone Encounter (Signed)
Wife reports that husband is refusing to go in for the cpap titration. Please advise.

## 2015-09-04 ENCOUNTER — Other Ambulatory Visit: Payer: Self-pay | Admitting: Physician Assistant

## 2015-09-11 ENCOUNTER — Ambulatory Visit (HOSPITAL_BASED_OUTPATIENT_CLINIC_OR_DEPARTMENT_OTHER): Payer: BLUE CROSS/BLUE SHIELD | Attending: Family Medicine | Admitting: Internal Medicine

## 2015-09-11 VITALS — Ht 73.0 in | Wt 285.0 lb

## 2015-09-11 DIAGNOSIS — G4733 Obstructive sleep apnea (adult) (pediatric): Secondary | ICD-10-CM | POA: Diagnosis not present

## 2015-09-11 DIAGNOSIS — R0683 Snoring: Secondary | ICD-10-CM | POA: Insufficient documentation

## 2015-09-20 DIAGNOSIS — G4733 Obstructive sleep apnea (adult) (pediatric): Secondary | ICD-10-CM

## 2015-09-20 NOTE — Procedures (Signed)
   Patient Name: Sailor, Haughn Date: 09/11/2015 Gender: Male D.O.B: [birth year] Age (years): 60 Referring Provider: Marcial Pacas Height (inches): 72 Interpreting Physician: Baird Lyons MD, ABSM Weight (lbs): 290 RPSGT: [place], Holly BMI: 39 MRN: [id number] Neck Size: 18.00 CLINICAL INFORMATION The patient is referred for a CPAP titration to treat sleep apnea.   Date of NPSG, Split Night or HST:  Diagnostic NPSG 08/11/15  AHI 44.1/ hr, desaturation to 74%, body weight 290 lbs  SLEEP STUDY TECHNIQUE As per the AASM Manual for the Scoring of Sleep and Associated Events v2.3 (April 2016) with a hypopnea requiring 4% desaturations. The channels recorded and monitored were frontal, central and occipital EEG, electrooculogram (EOG), submentalis EMG (chin), nasal and oral airflow, thoracic and abdominal wall motion, anterior tibialis EMG, snore microphone, electrocardiogram, and pulse oximetry. Continuous positive airway pressure (CPAP) was initiated at the beginning of the study and titrated to treat sleep-disordered breathing.  MEDICATIONS Medications taken by the patient : charted for review Medications administered by patient during sleep study : No sleep medicine administered.  TECHNICIAN COMMENTS Comments added by technician: none  Comments added by scorer: N/A  RESPIRATORY PARAMETERS Optimal PAP Pressure (cm): 15 AHI at Optimal Pressure (/hr): 0.0 Overall Minimal O2 (%): 85.00 Supine % at Optimal Pressure (%): 0 Minimal O2 at Optimal Pressure (%): 88.0    SLEEP ARCHITECTURE The study was initiated at 9:57:39 PM and ended at 4:14:55 AM. Sleep onset time was 17.0 minutes and the sleep efficiency was 68.3%. The total sleep time was 257.5 minutes. The patient spent 9.13% of the night in stage N1 sleep, 58.64% in stage N2 sleep, 7.57% in stage N3 and 24.66% in REM.Stage REM latency was 259.0 minutes Wake after sleep onset was 102.7. Alpha intrusion was absent. Supine  sleep was 30.86%.  CARDIAC DATA The 2 lead EKG demonstrated sinus rhythm. The mean heart rate was 54.09 beats per minute. Other EKG findings include: None.  LEG MOVEMENT DATA The total Periodic Limb Movements of Sleep (PLMS) were 24. The PLMS index was 5.59. A PLMS index of <15 is considered normal in adults.  IMPRESSIONS - The optimal PAP pressure was 15 cm of water. - Central sleep apnea was not noted during this titration (CAI = 0.9/h). - Moderate oxygen desaturations were observed during this titration (min O2 = 85.00%). - The patient snored with Soft snoring volume during this titration study. - No cardiac abnormalities were observed during this study. - Mild periodic limb movements were observed during this study. Arousals associated with PLMs were rare.  DIAGNOSIS - Obstructive Sleep Apnea (327.23 [G47.33 ICD-10])  RECOMMENDATIONS - Trial of CPAP therapy on 15 cm H2O with a Large size Fisher&Paykel Nasal Mask Eson mask and heated humidification. - Avoid alcohol, sedatives and other CNS depressants that may worsen sleep apnea and disrupt normal sleep architecture. - Sleep hygiene should be reviewed to assess factors that may improve sleep quality. - Weight management and regular exercise should be initiated or continued.  Deneise Lever Diplomate, American Board of Sleep Medicine  ELECTRONICALLY SIGNED ON:  09/20/2015, 10:05 AM [employer] PH: [phone]   FX: [phone] [place]

## 2015-09-21 ENCOUNTER — Telehealth: Payer: Self-pay | Admitting: Family Medicine

## 2015-09-21 DIAGNOSIS — G4733 Obstructive sleep apnea (adult) (pediatric): Secondary | ICD-10-CM

## 2015-09-21 MED ORDER — AMBULATORY NON FORMULARY MEDICATION: Status: DC

## 2015-09-21 NOTE — Telephone Encounter (Signed)
Error

## 2015-09-21 NOTE — Telephone Encounter (Signed)
Wife notified.

## 2015-09-21 NOTE — Telephone Encounter (Signed)
Will you please let patient know that his sleep apnea was resolved using a cpap device and I've printed off a Rx for this, can you please see if [facility] can help supply this device?

## 2015-10-09 DIAGNOSIS — G4733 Obstructive sleep apnea (adult) (pediatric): Secondary | ICD-10-CM | POA: Diagnosis not present

## 2015-10-21 ENCOUNTER — Encounter: Payer: Self-pay | Admitting: Physician Assistant

## 2015-10-21 ENCOUNTER — Ambulatory Visit (INDEPENDENT_AMBULATORY_CARE_PROVIDER_SITE_OTHER): Payer: BLUE CROSS/BLUE SHIELD | Admitting: Physician Assistant

## 2015-10-21 VITALS — BP 130/74 | HR 73 | Ht 73.0 in | Wt 284.0 lb

## 2015-10-21 DIAGNOSIS — R5383 Other fatigue: Secondary | ICD-10-CM

## 2015-10-21 DIAGNOSIS — Z131 Encounter for screening for diabetes mellitus: Secondary | ICD-10-CM

## 2015-10-21 DIAGNOSIS — Z23 Encounter for immunization: Secondary | ICD-10-CM

## 2015-10-21 DIAGNOSIS — F064 Anxiety disorder due to known physiological condition: Secondary | ICD-10-CM

## 2015-10-21 DIAGNOSIS — Z13220 Encounter for screening for lipoid disorders: Secondary | ICD-10-CM | POA: Diagnosis not present

## 2015-10-21 DIAGNOSIS — G4733 Obstructive sleep apnea (adult) (pediatric): Secondary | ICD-10-CM

## 2015-10-21 DIAGNOSIS — Z1159 Encounter for screening for other viral diseases: Secondary | ICD-10-CM | POA: Diagnosis not present

## 2015-10-21 DIAGNOSIS — N400 Enlarged prostate without lower urinary tract symptoms: Secondary | ICD-10-CM | POA: Insufficient documentation

## 2015-10-21 MED ORDER — TAMSULOSIN HCL 0.4 MG PO CAPS: 0.4000 mg | ORAL_CAPSULE | Freq: Every day | ORAL | Status: DC

## 2015-10-21 MED ORDER — LISINOPRIL 20 MG PO TABS: ORAL_TABLET | ORAL | Status: DC

## 2015-10-21 MED ORDER — ESCITALOPRAM OXALATE 20 MG PO TABS: 20.0000 mg | ORAL_TABLET | Freq: Every day | ORAL | Status: DC

## 2015-10-21 NOTE — Progress Notes (Signed)
   Subjective:    Patient ID: Kenneth Duncan, male    DOB: Dec 21, 1955, 60 y.o.   MRN: [id number]  HPI Pt is a 60 yo male who presents to the clinic with his wife to follow up.   OSA- not able to tolerate mask on CPAP machine. He wakes up in a panic. Not currently using.   Continues to have no energy.   He is having urinary symptoms of frequent urination. Gets up 5 times a night. Feels like this is disturbing his sleep as well.   Blood pressure controlled. No CP, palpitations, headaches.    Review of Systems  All other systems reviewed and are negative.      Objective:   Physical Exam  Constitutional: He is oriented to person, place, and time. He appears well-developed and well-nourished.  Obesity.   HENT:  Head: Normocephalic and atraumatic.  Cardiovascular: Normal rate, regular rhythm and normal heart sounds.   Pulmonary/Chest: Effort normal and breath sounds normal. He has no wheezes.  Neurological: He is alert and oriented to person, place, and time.  Skin: Skin is dry.  Psychiatric: He has a normal mood and affect. His behavior is normal.          Assessment & Plan:  OSA- discussed calling [facility] to discussed new mask or cannula that will help patient be more complaint.   No energy- will order fatigue panel. Once using CPAP could help.   BPH- AUA 22. Severe symptoms. Started flomax. PSA ordered. DRE declined. Will follow up at [facility] in September.   HTN- controlled. Continue lisinopril.   Anxiety- continue lexapro.   zostavax given in office today.

## 2015-10-23 ENCOUNTER — Encounter: Payer: Self-pay | Admitting: Physician Assistant

## 2015-10-27 DIAGNOSIS — Z1159 Encounter for screening for other viral diseases: Secondary | ICD-10-CM | POA: Diagnosis not present

## 2015-10-27 DIAGNOSIS — Z131 Encounter for screening for diabetes mellitus: Secondary | ICD-10-CM | POA: Diagnosis not present

## 2015-10-27 DIAGNOSIS — R5383 Other fatigue: Secondary | ICD-10-CM | POA: Diagnosis not present

## 2015-10-27 DIAGNOSIS — Z13220 Encounter for screening for lipoid disorders: Secondary | ICD-10-CM | POA: Diagnosis not present

## 2015-10-28 ENCOUNTER — Other Ambulatory Visit: Payer: Self-pay | Admitting: Physician Assistant

## 2015-10-28 ENCOUNTER — Encounter: Payer: Self-pay | Admitting: Physician Assistant

## 2015-10-28 DIAGNOSIS — E7800 Pure hypercholesterolemia, unspecified: Secondary | ICD-10-CM | POA: Insufficient documentation

## 2015-10-28 DIAGNOSIS — E559 Vitamin D deficiency, unspecified: Secondary | ICD-10-CM | POA: Insufficient documentation

## 2015-10-28 LAB — COMPLETE METABOLIC PANEL WITH GFR
ALK PHOS: 73 U/L (ref 40–115)
ALT: 26 U/L (ref 9–46)
AST: 21 U/L (ref 10–35)
Albumin: 4.4 g/dL (ref 3.6–5.1)
BILIRUBIN TOTAL: 1.3 mg/dL — AB (ref 0.2–1.2)
BUN: 12 mg/dL (ref 7–25)
CO2: 28 mmol/L (ref 20–31)
Calcium: 9.1 mg/dL (ref 8.6–10.3)
Chloride: 101 mmol/L (ref 98–110)
Creat: 1.08 mg/dL (ref 0.70–1.25)
GFR, EST NON AFRICAN AMERICAN: 74 mL/min (ref >=60)
GFR, Est African American: 86 mL/min (ref >=60)
GLUCOSE: 92 mg/dL (ref 65–99)
Potassium: 4.4 mmol/L (ref 3.5–5.3)
Sodium: 142 mmol/L (ref 135–146)
Total Protein: 6.9 g/dL (ref 6.1–8.1)

## 2015-10-28 LAB — CBC WITH DIFFERENTIAL/PLATELET
BASOS ABS: 0 {cells}/uL (ref 0–200)
Basophils Relative: 0 %
EOS PCT: 1 %
Eosinophils Absolute: 65 cells/uL (ref 15–500)
HCT: 44.3 % (ref 38.5–50.0)
Hemoglobin: 13.8 g/dL (ref 13.2–17.1)
Lymphocytes Relative: 25 %
Lymphs Abs: 1625 cells/uL (ref 850–3900)
MCH: 28.8 pg (ref 27.0–33.0)
MCHC: 31.2 g/dL — AB (ref 32.0–36.0)
MCV: 92.3 fL (ref 80.0–100.0)
MONOS PCT: 7 %
MPV: 10.3 fL (ref 7.5–12.5)
Monocytes Absolute: 455 cells/uL (ref 200–950)
NEUTROS PCT: 67 %
Neutro Abs: 4355 cells/uL (ref 1500–7800)
PLATELETS: 171 10*3/uL (ref 140–400)
RBC: 4.8 MIL/uL (ref 4.20–5.80)
RDW: 14.6 % (ref 11.0–15.0)
WBC: 6.5 10*3/uL (ref 3.8–10.8)

## 2015-10-28 LAB — HEPATITIS C ANTIBODY: HCV Ab: NEGATIVE

## 2015-10-28 LAB — PSA, TOTAL AND FREE
PSA, Free Pct: 30 % (ref >25)
PSA, Free: 0.19 ng/mL
PSA: 0.63 ng/mL (ref <=4.00)

## 2015-10-28 LAB — LIPID PANEL
Cholesterol: 231 mg/dL — ABNORMAL HIGH (ref 125–200)
HDL: 41 mg/dL (ref >=40)
LDL CALC: 135 mg/dL — AB (ref <130)
TRIGLYCERIDES: 277 mg/dL — AB (ref <150)
Total CHOL/HDL Ratio: 5.6 Ratio — ABNORMAL HIGH (ref <=5.0)
VLDL: 55 mg/dL — AB (ref <30)

## 2015-10-28 LAB — VITAMIN B12: Vitamin B-12: 269 pg/mL (ref 200–1100)

## 2015-10-28 LAB — TSH: TSH: 1.82 mIU/L (ref 0.40–4.50)

## 2015-10-28 LAB — VITAMIN D 25 HYDROXY (VIT D DEFICIENCY, FRACTURES): Vit D, 25-Hydroxy: 20 ng/mL — ABNORMAL LOW (ref 30–100)

## 2015-10-28 MED ORDER — AMBULATORY NON FORMULARY MEDICATION: Status: DC

## 2015-10-29 ENCOUNTER — Other Ambulatory Visit: Payer: Self-pay

## 2015-10-29 ENCOUNTER — Other Ambulatory Visit: Payer: Self-pay | Admitting: Physician Assistant

## 2015-10-29 DIAGNOSIS — G4733 Obstructive sleep apnea (adult) (pediatric): Secondary | ICD-10-CM

## 2015-10-29 MED ORDER — AMBULATORY NON FORMULARY MEDICATION: Status: DC

## 2015-11-05 ENCOUNTER — Other Ambulatory Visit: Payer: Self-pay | Admitting: *Deleted

## 2015-11-05 MED ORDER — CYANOCOBALAMIN 1000 MCG/ML IJ SOLN: 1000.0000 ug | Freq: Once | INTRAMUSCULAR | Status: DC

## 2015-11-05 MED ORDER — AMBULATORY NON FORMULARY MEDICATION: Status: AC

## 2015-11-05 MED ORDER — AMBULATORY NON FORMULARY MEDICATION: Status: DC

## 2015-11-08 DIAGNOSIS — G4733 Obstructive sleep apnea (adult) (pediatric): Secondary | ICD-10-CM | POA: Diagnosis not present

## 2015-11-17 ENCOUNTER — Other Ambulatory Visit: Payer: Self-pay | Admitting: Physician Assistant

## 2015-12-06 ENCOUNTER — Other Ambulatory Visit: Payer: Self-pay | Admitting: Physician Assistant

## 2015-12-09 DIAGNOSIS — G4733 Obstructive sleep apnea (adult) (pediatric): Secondary | ICD-10-CM | POA: Diagnosis not present

## 2016-01-09 DIAGNOSIS — G4733 Obstructive sleep apnea (adult) (pediatric): Secondary | ICD-10-CM | POA: Diagnosis not present

## 2016-01-10 ENCOUNTER — Other Ambulatory Visit: Payer: Self-pay | Admitting: Physician Assistant

## 2016-01-22 ENCOUNTER — Other Ambulatory Visit: Payer: Self-pay | Admitting: Physician Assistant

## 2016-02-02 ENCOUNTER — Encounter: Payer: Self-pay | Admitting: Family Medicine

## 2016-02-02 ENCOUNTER — Ambulatory Visit (INDEPENDENT_AMBULATORY_CARE_PROVIDER_SITE_OTHER): Payer: BLUE CROSS/BLUE SHIELD | Admitting: Family Medicine

## 2016-02-02 VITALS — BP 110/68 | HR 72 | Wt 291.0 lb

## 2016-02-02 DIAGNOSIS — R05 Cough: Secondary | ICD-10-CM

## 2016-02-02 DIAGNOSIS — G4733 Obstructive sleep apnea (adult) (pediatric): Secondary | ICD-10-CM

## 2016-02-02 DIAGNOSIS — R059 Cough, unspecified: Secondary | ICD-10-CM

## 2016-02-02 MED ORDER — ESCITALOPRAM OXALATE 20 MG PO TABS: 20.0000 mg | ORAL_TABLET | Freq: Every day | ORAL | 1 refills | Status: DC

## 2016-02-02 NOTE — Progress Notes (Signed)
   Subjective:    Patient ID: Kenneth Duncan, male    DOB: [birth year], 60 y.o.   MRN: [id number]  HPI Cough x 3 weeks at night. No fever, sweats and chills.     Cough is mostly worse at night. He feels like it's draining. He has been taking Flonase. He does feel like the cough is improved over the last week compared to 3 weeks ago. He denies any fevers chills or sweats. Sputum is clear. He had pneumonia about a year ago and ended up hospitalized for it.   OSA - he is really struggling to use his CPAP mask. He has tried several different versions and just feels like the pressure is set extremely high has not been able to tolerate it. The longest he was able to use it was about 9 days.   Review of Systems     Objective:   Physical Exam  Constitutional: He is oriented to person, place, and time. He appears well-developed and well-nourished.  HENT:  Head: Normocephalic and atraumatic.  Right Ear: External ear normal.  Left Ear: External ear normal.  Nose: Nose normal.  Mouth/Throat: Oropharynx is clear and moist.  TMs and canals are clear.   Eyes: Conjunctivae and EOM are normal. Pupils are equal, round, and reactive to light.  Neck: Neck supple. No thyromegaly present.  Cardiovascular: Normal rate and normal heart sounds.   Pulmonary/Chest: Effort normal and breath sounds normal.  Lymphadenopathy:    He has no cervical adenopathy.  Neurological: He is alert and oriented to person, place, and time.  Skin: Skin is warm and dry.  Psychiatric: He has a normal mood and affect.       Assessment & Plan:  Cough/upper respiratory infection-likely viral. It actually seems to be improving on its own. Lung exam is completely clear today and pulse ox is normal which is reassuring. Encouraged him to give it one more week. He can certainly call sooner if he feels like he is getting worse and we can always get a chest x-ray.  Sleep apnea-offered to refer him to a sleep specialist to his [facility]  board-certified sleep medicine to help him troubleshoot being able to wear the CPAP. He might also benefit from starting out at a lower pressure and things gradually working his way up. He says he will think about it and speak with Luvenia Starch about it.  Warned about the risks of not treating her sleep apnea.

## 2016-02-08 DIAGNOSIS — G4733 Obstructive sleep apnea (adult) (pediatric): Secondary | ICD-10-CM | POA: Diagnosis not present

## 2016-03-08 DIAGNOSIS — I872 Venous insufficiency (chronic) (peripheral): Secondary | ICD-10-CM | POA: Diagnosis not present

## 2016-03-08 DIAGNOSIS — R600 Localized edema: Secondary | ICD-10-CM | POA: Diagnosis not present

## 2016-03-10 DIAGNOSIS — G4733 Obstructive sleep apnea (adult) (pediatric): Secondary | ICD-10-CM | POA: Diagnosis not present

## 2016-03-21 ENCOUNTER — Other Ambulatory Visit: Payer: Self-pay | Admitting: *Deleted

## 2016-03-21 MED ORDER — LISINOPRIL 20 MG PO TABS: ORAL_TABLET | ORAL | 0 refills | Status: DC

## 2016-04-08 ENCOUNTER — Other Ambulatory Visit: Payer: Self-pay | Admitting: Physician Assistant

## 2016-04-09 DIAGNOSIS — G4733 Obstructive sleep apnea (adult) (pediatric): Secondary | ICD-10-CM | POA: Diagnosis not present

## 2016-04-13 ENCOUNTER — Telehealth: Payer: Self-pay | Admitting: Physician Assistant

## 2016-04-13 NOTE — Telephone Encounter (Signed)
Left message with pts husband to let him know he needs to call and schedule a f/u appt with his pcp for med f/u

## 2016-04-22 ENCOUNTER — Telehealth: Payer: Self-pay

## 2016-04-22 MED ORDER — AMOXICILLIN-POT CLAVULANATE 875-125 MG PO TABS: 1.0000 | ORAL_TABLET | Freq: Two times a day (BID) | ORAL | 0 refills | Status: DC

## 2016-04-22 NOTE — Telephone Encounter (Signed)
Kenneth Duncan wife, called and reports he is having yellow-green nasal drainage, headaches and pressure in face for a few days. He is taking mucinex with some relief. Denies fever, chills or cough. We do not have any openings and UC is a 2 hour wait, per Mechele Claude. She wanted to know if we could send something in for him. Please advise.

## 2016-04-22 NOTE — Telephone Encounter (Signed)
Ok to send augmentin 875/152m I po bid #20 NRF. If not improving follow up next week.

## 2016-04-22 NOTE — Telephone Encounter (Signed)
Medication sent in and wife is aware.

## 2016-05-10 DIAGNOSIS — G4733 Obstructive sleep apnea (adult) (pediatric): Secondary | ICD-10-CM | POA: Diagnosis not present

## 2016-05-18 ENCOUNTER — Other Ambulatory Visit: Payer: Self-pay | Admitting: Physician Assistant

## 2016-06-10 DIAGNOSIS — G4733 Obstructive sleep apnea (adult) (pediatric): Secondary | ICD-10-CM | POA: Diagnosis not present

## 2016-06-24 ENCOUNTER — Other Ambulatory Visit: Payer: Self-pay | Admitting: Physician Assistant

## 2016-06-27 ENCOUNTER — Telehealth: Payer: Self-pay | Admitting: Physician Assistant

## 2016-06-27 NOTE — Telephone Encounter (Signed)
I called and spoke to wife to let him know he is due for a F/u with Jade on Anxiety, HTN and BPH

## 2016-07-08 ENCOUNTER — Ambulatory Visit (INDEPENDENT_AMBULATORY_CARE_PROVIDER_SITE_OTHER): Payer: BLUE CROSS/BLUE SHIELD | Admitting: Physician Assistant

## 2016-07-08 ENCOUNTER — Encounter: Payer: Self-pay | Admitting: Physician Assistant

## 2016-07-08 VITALS — BP 133/87 | HR 69 | Ht 73.0 in | Wt 296.0 lb

## 2016-07-08 DIAGNOSIS — K2100 Gastro-esophageal reflux disease with esophagitis, without bleeding: Secondary | ICD-10-CM

## 2016-07-08 DIAGNOSIS — Z1211 Encounter for screening for malignant neoplasm of colon: Secondary | ICD-10-CM | POA: Diagnosis not present

## 2016-07-08 DIAGNOSIS — N400 Enlarged prostate without lower urinary tract symptoms: Secondary | ICD-10-CM

## 2016-07-08 DIAGNOSIS — I10 Essential (primary) hypertension: Secondary | ICD-10-CM | POA: Diagnosis not present

## 2016-07-08 DIAGNOSIS — F331 Major depressive disorder, recurrent, moderate: Secondary | ICD-10-CM

## 2016-07-08 DIAGNOSIS — Z23 Encounter for immunization: Secondary | ICD-10-CM

## 2016-07-08 DIAGNOSIS — J301 Allergic rhinitis due to pollen: Secondary | ICD-10-CM

## 2016-07-08 DIAGNOSIS — G4733 Obstructive sleep apnea (adult) (pediatric): Secondary | ICD-10-CM | POA: Diagnosis not present

## 2016-07-08 DIAGNOSIS — K900 Celiac disease: Secondary | ICD-10-CM | POA: Diagnosis not present

## 2016-07-08 MED ORDER — LISINOPRIL 20 MG PO TABS: 20.0000 mg | ORAL_TABLET | Freq: Every day | ORAL | 3 refills | Status: DC

## 2016-07-08 MED ORDER — FLUTICASONE PROPIONATE 50 MCG/ACT NA SUSP: NASAL | 3 refills | Status: DC

## 2016-07-08 MED ORDER — OMEPRAZOLE 20 MG PO CPDR: 20.0000 mg | DELAYED_RELEASE_CAPSULE | Freq: Every day | ORAL | 3 refills | Status: DC

## 2016-07-08 MED ORDER — DAPSONE 100 MG PO TABS: 100.0000 mg | ORAL_TABLET | Freq: Two times a day (BID) | ORAL | 3 refills | Status: DC

## 2016-07-08 MED ORDER — TAMSULOSIN HCL 0.4 MG PO CAPS: 0.4000 mg | ORAL_CAPSULE | Freq: Every day | ORAL | 3 refills | Status: DC

## 2016-07-08 MED ORDER — ESCITALOPRAM OXALATE 20 MG PO TABS: 20.0000 mg | ORAL_TABLET | Freq: Every day | ORAL | 3 refills | Status: DC

## 2016-07-08 NOTE — Progress Notes (Signed)
   Subjective:    Patient ID: Kenneth Duncan, male    DOB: 1955/08/11, 61 y.o.   MRN: [id number]  HPI Pt is a 61 yo male who presents to the clinic for medication refills.   He has no complaints today. He is feeling great. Denies any depression. Symptoms are controlled on medication.   bPH- flow is good on flomax. Getting up less at night. No weak stream.   OSA- using most nights for at least 5 hours. No problems.    Review of Systems  All other systems reviewed and are negative.      Objective:   Physical Exam  Constitutional: He is oriented to person, place, and time. He appears well-developed and well-nourished.  HENT:  Head: Normocephalic and atraumatic.  Cardiovascular: Normal rate, regular rhythm and normal heart sounds.   Pulmonary/Chest: Effort normal and breath sounds normal.  Neurological: He is alert and oriented to person, place, and time.  Psychiatric: He has a normal mood and affect. His behavior is normal.          Assessment & Plan:  Marland KitchenMarland KitchenDiagnoses and all orders for this visit:  Essential hypertension -     lisinopril (PRINIVIL,ZESTRIL) 20 MG tablet; Take 1 tablet (20 mg total) by mouth daily.  Colon cancer screening -     Ambulatory referral to Gastroenterology  Celiac disease -     dapsone 100 MG tablet; Take 1 tablet (100 mg total) by mouth 2 (two) times daily.  Benign prostatic hyperplasia, unspecified whether lower urinary tract symptoms present -     tamsulosin (FLOMAX) 0.4 MG CAPS capsule; Take 1 capsule (0.4 mg total) by mouth daily.  Gastroesophageal reflux disease with esophagitis -     omeprazole (PRILOSEC) 20 MG capsule; Take 1 capsule (20 mg total) by mouth daily.  Moderate episode of recurrent major depressive disorder (HCC) -     escitalopram (LEXAPRO) 20 MG tablet; Take 1 tablet (20 mg total) by mouth daily.  Acute seasonal allergic rhinitis due to pollen -     fluticasone (FLONASE) 50 MCG/ACT nasal spray; INSTILL ONE SPRAY IN EACH  NOSTRIL ONE TIME DAILY  Need for Tdap vaccination -     Tdap vaccine greater than or equal to 7yo IM  OSA (obstructive sleep apnea)   .Marland Kitchen Depression screen [facility] 2/9 07/08/2016  Decreased Interest 0  Down, Depressed, Hopeless 0  PHQ - 2 Score 0   Medications refilled.  BP looks good.  Labs not due until 10/2016. Discussed weight loss.

## 2016-09-08 ENCOUNTER — Telehealth: Payer: Self-pay | Admitting: Physician Assistant

## 2016-09-08 ENCOUNTER — Other Ambulatory Visit: Payer: Self-pay | Admitting: Physician Assistant

## 2016-09-08 MED ORDER — IBUPROFEN 800 MG PO TABS: 800.0000 mg | ORAL_TABLET | Freq: Three times a day (TID) | ORAL | 2 refills | Status: DC | PRN

## 2016-09-08 MED ORDER — IBUPROFEN 800 MG PO TABS: 800.0000 mg | ORAL_TABLET | Freq: Three times a day (TID) | ORAL | 0 refills | Status: DC | PRN

## 2016-09-08 NOTE — Telephone Encounter (Signed)
Ok I sent refill. Long term NSAID use can cause GI ulcers, bleed, upset. Contact us with any symptoms of these.

## 2016-09-08 NOTE — Telephone Encounter (Signed)
Left information on Pt's VM. Callback provided for any questions.

## 2016-10-05 ENCOUNTER — Other Ambulatory Visit: Payer: Self-pay | Admitting: Physician Assistant

## 2016-10-17 DIAGNOSIS — D120 Benign neoplasm of cecum: Secondary | ICD-10-CM | POA: Diagnosis not present

## 2016-10-17 DIAGNOSIS — D122 Benign neoplasm of ascending colon: Secondary | ICD-10-CM | POA: Diagnosis not present

## 2016-10-17 DIAGNOSIS — Z1211 Encounter for screening for malignant neoplasm of colon: Secondary | ICD-10-CM | POA: Diagnosis not present

## 2016-10-17 DIAGNOSIS — D123 Benign neoplasm of transverse colon: Secondary | ICD-10-CM | POA: Diagnosis not present

## 2016-10-17 DIAGNOSIS — K640 First degree hemorrhoids: Secondary | ICD-10-CM | POA: Diagnosis not present

## 2016-10-17 HISTORY — PX: COLONOSCOPY W/ BIOPSIES: SHX1374

## 2016-10-17 LAB — HM COLONOSCOPY

## 2016-10-20 ENCOUNTER — Other Ambulatory Visit: Payer: Self-pay | Admitting: *Deleted

## 2016-10-20 DIAGNOSIS — J301 Allergic rhinitis due to pollen: Secondary | ICD-10-CM

## 2016-10-20 MED ORDER — FLUTICASONE PROPIONATE 50 MCG/ACT NA SUSP: NASAL | 3 refills | Status: DC

## 2016-10-29 ENCOUNTER — Other Ambulatory Visit: Payer: Self-pay | Admitting: Physician Assistant

## 2016-11-01 ENCOUNTER — Encounter: Payer: Self-pay | Admitting: Physician Assistant

## 2016-11-01 DIAGNOSIS — Z86010 Personal history of colonic polyps: Secondary | ICD-10-CM | POA: Insufficient documentation

## 2016-11-04 ENCOUNTER — Telehealth: Payer: Self-pay

## 2016-11-04 NOTE — Telephone Encounter (Signed)
Wife of pt notified and will contact pain clinic. If pt needs PCP referral pt does want to go to a neurosurgeon.

## 2016-11-04 NOTE — Telephone Encounter (Signed)
He may want to call his pain clinic for the referral unless needs to come from PCP. Has he alrady had a consult with them?  Does he want ortho or neurosurgery?

## 2016-11-04 NOTE — Telephone Encounter (Signed)
Wife of pt called stating that pt would like to go ahead and have his back surgery done and would like to know if we can get there process started for him. Would prefer to have surgery done by [facility]. Please advise.   [place]

## 2016-11-18 ENCOUNTER — Encounter: Payer: Self-pay | Admitting: Physician Assistant

## 2016-12-04 ENCOUNTER — Other Ambulatory Visit: Payer: Self-pay | Admitting: Physician Assistant

## 2017-01-16 ENCOUNTER — Other Ambulatory Visit: Payer: Self-pay | Admitting: Physician Assistant

## 2017-03-07 ENCOUNTER — Telehealth: Payer: Self-pay | Admitting: Emergency Medicine

## 2017-03-07 ENCOUNTER — Ambulatory Visit: Payer: BLUE CROSS/BLUE SHIELD | Admitting: Family Medicine

## 2017-03-07 ENCOUNTER — Encounter: Payer: Self-pay | Admitting: Family Medicine

## 2017-03-07 VITALS — BP 149/84 | HR 74 | Temp 98.1°F | Resp 12 | Wt 283.0 lb

## 2017-03-07 DIAGNOSIS — T783XXA Angioneurotic edema, initial encounter: Secondary | ICD-10-CM

## 2017-03-07 MED ORDER — RANITIDINE HCL 300 MG PO TABS: 300.0000 mg | ORAL_TABLET | Freq: Two times a day (BID) | ORAL | 1 refills | Status: DC

## 2017-03-07 MED ORDER — EPINEPHRINE 0.3 MG/0.3ML IJ SOAJ: 0.3000 mg | Freq: Once | INTRAMUSCULAR | 1 refills | Status: AC

## 2017-03-07 MED ORDER — CETIRIZINE HCL 10 MG PO TABS: 10.0000 mg | ORAL_TABLET | Freq: Every day | ORAL | 3 refills | Status: DC

## 2017-03-07 MED ORDER — IRBESARTAN 150 MG PO TABS: 150.0000 mg | ORAL_TABLET | Freq: Every day | ORAL | 2 refills | Status: DC

## 2017-03-07 NOTE — Patient Instructions (Addendum)
Thank you for coming in today. STOP lisinopril.  Start Irbesartan  Take allegra or claraitin or preferentially Zyrtec (certizine) daily.  Take ranitidine twice daily for a few days.  You can use benadryl in addition.  Use the epipen as needed if worse.  Call 911 if you use the epipen.      Angioedema Angioedema is the sudden swelling of tissue in the body. Angioedema can affect any part of the body, but it most often affects the deeper parts of the skin, causing red, itchy patches (hives) to appear over the affected area. It often begins during the night and is found in the morning. Depending on the cause, angioedema may happen:  Only once.  Several times. It may come back in unpredictable patterns.  Repeatedly for several years. Over time, it may gradually stop coming back.  Angioedema can be life-threatening if it affects the air passages that you breathe through. What are the causes? This condition may be caused by:  Foods, such as milk, eggs, shellfish, wheat, or nuts.  Certain medicines, such as ACE inhibitors, antibiotics, nonsteroidal anti-inflammatory drugs, birth control pills, or dyes used in X-rays.  Insect stings.  Infections.  Angioedema can be inherited, and episodes can be triggered by:  Mild injury.  Dental work.  Surgery.  Stress.  Sudden changes in temperature.  Exercise.  In some cases, the cause of this condition is not known. What are the signs or symptoms? Symptoms of this condition depend on where the swelling happens. Symptoms may include:  Swollen skin.  Red, itchy patches of skin (hives).  Redness in the affected area.  Pain in the affected area.  Swollen lips or tongue.  Wheezing.  Breathing problems.  If your internal organs are involved, symptoms may also include:  Nausea.  Abdominal pain.  Vomiting.  Difficulty swallowing.  Difficulty passing urine.  How is this diagnosed? This condition may be diagnosed based  on:  An exam of the affected area.  Your medical history.  Whether anyone in your family has had this condition before.  A review of any medicines you have been taking.  Tests, including: ? Allergy skin tests to see if the condition was caused by an allergic reaction. ? Blood tests to see if the condition was caused by a gene. ? Tests to check for underlying diseases that could cause the condition.  How is this treated? Treatment for this condition depends on the cause. It may involve any of the following:  If something triggered the condition, making changes to keep it from triggering the condition again.  If the condition affects your breathing, having tubes placed in your airway to keep it open.  Taking medicines to treat symptoms or prevent future episodes. These may include: ? Antihistamines. ? Epinephrine injections. ? Steroids.  If your condition is severe, you may need to be treated at the hospital. Angioedema usually gets better in 24-48 hours. Follow these instructions at home:  Take over-the-counter and prescription medicines only as told by your health care provider.  If you were given medicines for emergency allergy treatment, always carry them with you.  Wear a medical bracelet as told by your health care provider.  If something triggers your condition, avoid the trigger, if possible.  If your condition is inherited and you are thinking about having children, talk to your health care provider. It is important to discuss the risks of passing on the condition to your children. Contact a health care provider if:  You have repeated episodes of angioedema.  Episodes of angioedema start to happen more often than they used to, even after you take steps to prevent them.  You have episodes of angioedema that are more severe than they have been before, even after you take steps to prevent them.  You are thinking about having children. Get help right away if:  You  have severe swelling of your mouth, tongue, or lips.  You have trouble breathing.  You have trouble swallowing.  You faint. This information is not intended to replace advice given to you by your health care provider. Make sure you discuss any questions you have with your health care provider. Document Released: 06/13/2001 Document Revised: 10/31/2015 Document Reviewed: 10/13/2015 Elsevier Interactive Patient Education  2018 [place]   Epinephrine injection (Auto-injector) What is this medicine? EPINEPHRINE (ep i NEF rin) is used for the emergency treatment of severe allergic reactions. You should keep this medicine with you at all times. This medicine may be used for other purposes; ask your health care provider or pharmacist if you have questions. COMMON BRAND NAME(S): Adrenaclick, Auvi-Q, epinephrinesnap, epinephrinesnap-v, EpiPen, EPIsnap Epinephrine, SYMJEPI, Twinject What should I tell my health care provider before I take this medicine? They need to know if you have any of the following conditions: -diabetes -heart disease -high blood pressure -lung or breathing disease, like asthma -Parkinson's disease -thyroid disease -an unusual or allergic reaction to epinephrine, sulfites, other medicines, foods, dyes, or preservatives -pregnant or trying to get pregnant -breast-feeding How should I use this medicine? This medicine is for injection into the outer thigh. Your doctor or health care professional will instruct you on the proper use of the device during an emergency. Read all directions carefully and make sure you understand them. Do not use more often than directed. Talk to your pediatrician regarding the use of this medicine in children. Special care may be needed. This drug is commonly used in children. A special device is available for use in children. If you are giving this medicine to a young child, hold their leg firmly in place before and during the injection to  prevent injury. Overdosage: If you think you have taken too much of this medicine contact a poison control center or emergency room at once. NOTE: This medicine is only for you. Do not share this medicine with others. What if I miss a dose? This does not apply. You should only use this medicine for an allergic reaction. What may interact with this medicine? This medicine is only used during an emergency. Significant drug interactions are not likely during emergency use. This list may not describe all possible interactions. Give your health care provider a list of all the medicines, herbs, non-prescription drugs, or dietary supplements you use. Also tell them if you smoke, drink alcohol, or use illegal drugs. Some items may interact with your medicine. What should I watch for while using this medicine? Keep this medicine ready for use in the case of a severe allergic reaction. Make sure that you have the phone number of your doctor or health care professional and local hospital ready. Remember to check the expiration date of your medicine regularly. You may need to have additional units of this medicine with you at work, school, or other places. Talk to your doctor or health care professional about your need for extra units. Some emergencies may require an additional dose. Check with your doctor or a health care professional before using an extra dose. After  use, go to the nearest hospital or call 911. Avoid physical activity. Make sure the treating health care professional knows you have received an injection of this medicine. You will receive additional instructions on what to do during and after use of this medicine before a medical emergency occurs. What side effects may I notice from receiving this medicine? Side effects that you should report to your doctor or health care professional as soon as possible: -allergic reactions like skin rash, itching or hives, swelling of the face, lips, or  tongue -breathing problems -chest pain -fast, irregular heartbeat -pain, tingling, numbness in the hands or feet -pain, redness, or irritation at site where injected -vomiting Side effects that usually do not require medical attention (report to your doctor or health care professional if they continue or are bothersome): -anxious -dizziness -dry mouth -headache -increased sweating -nausea -unusually weak or tired This list may not describe all possible side effects. Call your doctor for medical advice about side effects. You may report side effects to FDA at 1-800-FDA-1088. Where should I keep my medicine? Keep out of the reach of children. Store at room temperature between 15 and 30 degrees C (59 and 86 degrees F). Protect from light and heat. The solution should be clear in color. If the solution is discolored or contains particles it must be replaced. Throw away any unused medicine after the expiration date. Ask your doctor or pharmacist about proper disposal of the injector if it is expired or has been used. Always replace your auto-injector before it expires. NOTE: This sheet is a summary. It may not cover all possible information. If you have questions about this medicine, talk to your doctor, pharmacist, or health care provider.  2018 Elsevier/Gold Standard (2014-09-08 12:24:50)

## 2017-03-07 NOTE — Telephone Encounter (Signed)
Patient's wife calling requesting rx for Zyrtect versus her getting it OTC due to cost. She will get Claritan OTC today for the interim. Patient also wants to be seen in future to discuss his back pain issues and will appt. accordingly. pk

## 2017-03-07 NOTE — Telephone Encounter (Signed)
[facility]

## 2017-03-07 NOTE — Progress Notes (Signed)
Kenneth Duncan is a 61 y.o. male who presents to [facility] today for lip swelling. Kenneth Duncan developed lip tingling and swelling of upper and lower lip in the early morning today. He was given Benadryl at about 2 AM and again in about 6 AM this morning. He notes the swelling is diminishing. He denies any trouble breathing or swallowing. He denies any fevers or chills. He notes that he had some mild lip tingling or swelling over the last few months.  He takes lisinopril daily and has done so for over 10 years without any problems.   Past Medical History:  Diagnosis Date  . Celiac disease   . Degenerative disc disease, lumbar   . GERD (gastroesophageal reflux disease)   . Hypertension    Past Surgical History:  Procedure Laterality Date  . ANTERIOR CERVICAL DECOMP/DISCECTOMY FUSION    . APPENDECTOMY    . clavicle     distal end removed.  . COLONOSCOPY W/ BIOPSIES  10/17/2016  . knee arthoscopic surgery      Bilateral menicus tear   Social History   Tobacco Use  . Smoking status: Never Smoker  . Smokeless tobacco: Never Used  Substance Use Topics  . Alcohol use: No   family history includes Diabetes in his father and sister; Heart attack in his father and sister; Heart attack (age of onset: 59) in his mother; Heart disease in his father and sister; Lung cancer in his brother; Lymphoma in his brother; Uterine cancer in his mother and sister.  ROS as above:  Medications: Current Outpatient Medications  Medication Sig Dispense Refill  . AMBULATORY NON FORMULARY MEDICATION CPAP pressure to be auto titration 5-20cm H20 and mask adjusted as needed. Diagnosis: OSA 1 Units 0  . AMBULATORY NON FORMULARY MEDICATION Syringes and 1" 25g needles for B12 injections 12 each 1  . cyanocobalamin (,VITAMIN B-12,) 1000 MCG/ML injection Inject 1 mL (1,000 mcg total) into the  muscle once. Please include syringes for injection. 10 mL 6  . dapsone 100 MG tablet Take 1 tablet (100 mg total) by mouth 2 (two) times daily. 180 tablet 3  . EPINEPHrine 0.3 mg/0.3 mL IJ SOAJ injection Inject 0.3 mLs (0.3 mg total) into the muscle once for 1 dose. 1 Device 1  . escitalopram (LEXAPRO) 20 MG tablet Take 1 tablet (20 mg total) by mouth daily. 90 tablet 3  . fluticasone (FLONASE) 50 MCG/ACT nasal spray INSTILL ONE SPRAY IN EACH NOSTRIL ONE TIME DAILY 16 g 3  . ibuprofen (ADVIL,MOTRIN) 800 MG tablet Take 1 tablet (800 mg total) by mouth every 8 (eight) hours as needed. 60 tablet 2  . ibuprofen (ADVIL,MOTRIN) 800 MG tablet TAKE 1 TABLET BY MOUTH EVERY 8 HOURS AS NEEDED 90 tablet 0  . irbesartan (AVAPRO) 150 MG tablet Take 1 tablet (150 mg total) by mouth daily. 30 tablet 2  . omeprazole (PRILOSEC) 20 MG capsule Take 1 capsule (20 mg total) by mouth daily. 90 capsule 3  . ranitidine (ZANTAC) 300 MG tablet Take 1 tablet (300 mg total) by mouth 2 (two) times daily. 60 tablet 1  . tamsulosin (FLOMAX) 0.4 MG CAPS capsule Take 1 capsule (0.4 mg total) by mouth daily. 90 capsule 3  . tamsulosin (FLOMAX) 0.4 MG CAPS capsule TAKE ONE CAPSULE BY MOUTH EVERY DAY 30 capsule 1   No current facility-administered medications for this visit.    Allergies  Allergen Reactions  . Lisinopril  Swelling    Angioedema limited to lips  . Penicillins     Pt does not remember allergy- told as child    Health Maintenance Health Maintenance  Topic Date Due  . INFLUENZA VACCINE  11/16/2016  . HIV Screening  07/09/2026 (Originally 05/07/1970)  . TETANUS/TDAP  07/09/2026  . COLONOSCOPY  10/18/2026  . Hepatitis C Screening  Completed     Exam:  BP (!) 149/84   Pulse 74   Temp 98.1 F (36.7 C)   Resp 12   Wt 283 lb (128.4 kg)   SpO2 96%   BMI 37.34 kg/m  Gen: Well NAD HEENT: EOMI,  MMM upper lower lip or swollen worse in the right. No tongue swelling.  Lungs: Normal work of breathing. CTABL  no stridor Heart: RRR no MRG Abd: NABS, Soft. Nondistended, Nontender Exts: Brisk capillary refill, warm and well perfused.    No results found for this or any previous visit (from the past 72 hour(s)). No results found.    Assessment and Plan: 61 y.o. male with angioedema very likely due to ACE inhibitor. Plan to discontinue lisinopril and treat with H1 and H2 blockers. I prescribed an epinephrine pen as well for use as needed. Will switch to angiotension receptor blocker. Review data and notes that this is very unlikely to resolve and angioedema. Recommend recheck with PCP in one month. Return sooner if needed. Precautions discussed.   No orders of the defined types were placed in this encounter.  Meds ordered this encounter  Medications  . irbesartan (AVAPRO) 150 MG tablet    Sig: Take 1 tablet (150 mg total) by mouth daily.    Dispense:  30 tablet    Refill:  2  . ranitidine (ZANTAC) 300 MG tablet    Sig: Take 1 tablet (300 mg total) by mouth 2 (two) times daily.    Dispense:  60 tablet    Refill:  1  . EPINEPHrine 0.3 mg/0.3 mL IJ SOAJ injection    Sig: Inject 0.3 mLs (0.3 mg total) into the muscle once for 1 dose.    Dispense:  1 Device    Refill:  1     Discussed warning signs or symptoms. Please see discharge instructions. Patient expresses understanding.  I spent 25 minutes with this patient, greater than 50% was face-to-face time counseling regarding ddx and treatment options.

## 2017-03-18 ENCOUNTER — Other Ambulatory Visit: Payer: Self-pay | Admitting: Physician Assistant

## 2017-04-05 ENCOUNTER — Other Ambulatory Visit: Payer: Self-pay | Admitting: Physician Assistant

## 2017-05-02 ENCOUNTER — Other Ambulatory Visit: Payer: Self-pay | Admitting: Family Medicine

## 2017-05-03 ENCOUNTER — Other Ambulatory Visit: Payer: Self-pay | Admitting: Physician Assistant

## 2017-05-03 ENCOUNTER — Other Ambulatory Visit: Payer: Self-pay

## 2017-05-03 MED ORDER — CYANOCOBALAMIN 1000 MCG/ML IJ SOLN: 1000.0000 ug | INTRAMUSCULAR | 0 refills | Status: DC

## 2017-05-15 ENCOUNTER — Telehealth: Payer: Self-pay | Admitting: Physician Assistant

## 2017-05-15 NOTE — Telephone Encounter (Signed)
Wife Mechele Claude calls and states would like to know if you could put in an order for Kenneth Duncan to have an MRI of his back due to increasing pain, not sure how much longer he will be able to work with it.  They want to see a surgeon named Katherine Roan who is through the workers comp but they can not do that until an MRI has been done.  Please advise. KG LPN

## 2017-05-16 ENCOUNTER — Other Ambulatory Visit: Payer: Self-pay | Admitting: Physician Assistant

## 2017-05-16 DIAGNOSIS — M5136 Other intervertebral disc degeneration, lumbar region: Secondary | ICD-10-CM

## 2017-05-16 NOTE — Telephone Encounter (Signed)
Not sure if patient will need a recent visit to be approved. He has seen pain clinic but not myself. I did go ahead and place order for MRI.

## 2017-05-17 ENCOUNTER — Ambulatory Visit (INDEPENDENT_AMBULATORY_CARE_PROVIDER_SITE_OTHER): Payer: Worker's Compensation | Admitting: Physician Assistant

## 2017-05-17 ENCOUNTER — Encounter: Payer: Self-pay | Admitting: Physician Assistant

## 2017-05-17 VITALS — BP 157/90 | HR 72 | Ht 73.0 in | Wt 292.0 lb

## 2017-05-17 DIAGNOSIS — M5136 Other intervertebral disc degeneration, lumbar region: Secondary | ICD-10-CM | POA: Diagnosis not present

## 2017-05-17 DIAGNOSIS — M533 Sacrococcygeal disorders, not elsewhere classified: Secondary | ICD-10-CM | POA: Diagnosis not present

## 2017-05-17 DIAGNOSIS — G8929 Other chronic pain: Secondary | ICD-10-CM | POA: Diagnosis not present

## 2017-05-17 DIAGNOSIS — I10 Essential (primary) hypertension: Secondary | ICD-10-CM | POA: Diagnosis not present

## 2017-05-17 DIAGNOSIS — M5441 Lumbago with sciatica, right side: Secondary | ICD-10-CM | POA: Diagnosis not present

## 2017-05-17 MED ORDER — KETOROLAC TROMETHAMINE 60 MG/2ML IM SOLN
60.0000 mg | Freq: Once | INTRAMUSCULAR | Status: AC
Start: 2017-05-17 — End: 2017-05-17
  Administered 2017-05-17: 60 mg via INTRAMUSCULAR

## 2017-05-17 MED ORDER — TRAMADOL HCL 50 MG PO TABS: 50.0000 mg | ORAL_TABLET | Freq: Four times a day (QID) | ORAL | 0 refills | Status: DC | PRN

## 2017-05-17 MED ORDER — IRBESARTAN 300 MG PO TABS: 300.0000 mg | ORAL_TABLET | Freq: Every day | ORAL | 1 refills | Status: DC

## 2017-05-17 NOTE — Progress Notes (Signed)
Subjective:    Patient ID: Kenneth Duncan, male    DOB: 16-Jun-1955, 62 y.o.   MRN: [id number]  HPI  Pt is a 62 yo male with OSA, chronic back pain who presents to the clinic with worsening of the chronic back pain. He sees pain clinic for injections. 11/8 was his last injection in SI joint. His next visit is 2/8. In [place] before he moved he was getting epidural injections as well. He does not have recent MRI.no new injury but back pain is worsening. Rates 10/10 at times after a long day at work. At times he does not even want to stand up straight. He has tried PT numerous times but none recently.  Ibuprofen seems to help the most. He has tried mobic, diclofenac and does not seem to help more. Gabapentin makes him too sleepy. At times pain does radiate down legs. No bowel or bladder dysfunction. No saddle anesthesia.   Marland Kitchen. Active Ambulatory Problems    Diagnosis Date Noted  . Chronic low back pain 06/14/2011  . Degenerative disc disease, lumbar 06/14/2011  . Hypertension 06/14/2011  . GERD (gastroesophageal reflux disease) 06/14/2011  . Anxiety disorder due to general medical condition 06/14/2011  . Celiac disease 06/14/2011  . B12 deficiency 07/28/2012  . Herpetiformis dermatitis 07/28/2012  . Hemorrhoid prolapse 08/15/2013  . OSA (obstructive sleep apnea) 08/24/2015  . Benign prostatic hyperplasia 10/21/2015  . No energy 10/21/2015  . Elevated LDL cholesterol level 10/28/2015  . Vitamin D deficiency 10/28/2015  . History of colon polyps 11/01/2016  . Angio-edema 03/07/2017  . Chronic SI joint pain 05/21/2017   Resolved Ambulatory Problems    Diagnosis Date Noted  . No Resolved Ambulatory Problems   Past Medical History:  Diagnosis Date  . Celiac disease   . Degenerative disc disease, lumbar   . GERD (gastroesophageal reflux disease)   . Hypertension       Review of Systems  All other systems reviewed and are negative.      Objective:   Physical Exam   Constitutional: He is oriented to person, place, and time. He appears well-developed and well-nourished.  HENT:  Head: Normocephalic and atraumatic.  Cardiovascular: Normal rate, regular rhythm and normal heart sounds.  Pulmonary/Chest: Effort normal and breath sounds normal.  Musculoskeletal:  Tenderness more over bilateral SI joints.  ROM limited due to pain.  Negative straight leg test.  parapsinal muscles are tight.   Neurological: He is alert and oriented to person, place, and time.  Psychiatric: He has a normal mood and affect. His behavior is normal.          Assessment & Plan:  Marland KitchenMarland KitchenKoichi was seen today for back pain.  Diagnoses and all orders for this visit:  Degenerative disc disease, lumbar -     traMADol (ULTRAM) 50 MG tablet; Take 1 tablet (50 mg total) by mouth every 6 (six) hours as needed. -     ketorolac (TORADOL) injection 60 mg  Chronic bilateral low back pain with right-sided sciatica -     traMADol (ULTRAM) 50 MG tablet; Take 1 tablet (50 mg total) by mouth every 6 (six) hours as needed. -     ketorolac (TORADOL) injection 60 mg  Chronic SI joint pain -     traMADol (ULTRAM) 50 MG tablet; Take 1 tablet (50 mg total) by mouth every 6 (six) hours as needed. -     ketorolac (TORADOL) injection 60 mg  Essential hypertension -  irbesartan (AVAPRO) 300 MG tablet; Take 1 tablet (300 mg total) by mouth daily.   BP elevated today. Increase avapro to 326m daily. Recheck in 4 weeks.   Ordered MRI.  Toradol and continue with motrin tomorrow.  Tramadol as needed for more severe pain.

## 2017-05-17 NOTE — Patient Instructions (Signed)
MRI ordered.  Tramadol as needed for pain.  Increased avapro to 332m daily for BP.

## 2017-05-18 NOTE — Telephone Encounter (Signed)
No auth required. Imaging notified.

## 2017-05-21 DIAGNOSIS — G8929 Other chronic pain: Secondary | ICD-10-CM | POA: Insufficient documentation

## 2017-05-21 DIAGNOSIS — M533 Sacrococcygeal disorders, not elsewhere classified: Secondary | ICD-10-CM

## 2017-05-23 ENCOUNTER — Other Ambulatory Visit: Payer: Self-pay | Admitting: Physician Assistant

## 2017-05-23 ENCOUNTER — Ambulatory Visit
Admission: RE | Admit: 2017-05-23 | Discharge: 2017-05-23 | Disposition: A | Discharge status: Home / Self Care | Payer: Worker's Compensation | Source: Ambulatory Visit | Attending: Physician Assistant | Admitting: Physician Assistant

## 2017-05-23 ENCOUNTER — Encounter: Payer: Self-pay | Admitting: Physician Assistant

## 2017-05-23 DIAGNOSIS — Z0189 Encounter for other specified special examinations: Secondary | ICD-10-CM

## 2017-05-23 DIAGNOSIS — Z1389 Encounter for screening for other disorder: Secondary | ICD-10-CM

## 2017-05-23 DIAGNOSIS — M5126 Other intervertebral disc displacement, lumbar region: Secondary | ICD-10-CM | POA: Insufficient documentation

## 2017-05-23 DIAGNOSIS — M5136 Other intervertebral disc degeneration, lumbar region: Secondary | ICD-10-CM

## 2017-05-23 DIAGNOSIS — M48061 Spinal stenosis, lumbar region without neurogenic claudication: Secondary | ICD-10-CM | POA: Insufficient documentation

## 2017-05-23 NOTE — Progress Notes (Signed)
Call pt:  L4/5 disc protrusion.  L5/S1 severe narrowing.   Do you already have appt with orthopedics. I would suggest getting copy of MRI and taking with you.

## 2017-05-25 ENCOUNTER — Telehealth: Payer: Self-pay | Admitting: *Deleted

## 2017-05-25 DIAGNOSIS — G8929 Other chronic pain: Secondary | ICD-10-CM

## 2017-05-25 DIAGNOSIS — M5441 Lumbago with sciatica, right side: Principal | ICD-10-CM

## 2017-05-25 DIAGNOSIS — M48061 Spinal stenosis, lumbar region without neurogenic claudication: Secondary | ICD-10-CM

## 2017-05-25 DIAGNOSIS — M5126 Other intervertebral disc displacement, lumbar region: Secondary | ICD-10-CM

## 2017-05-25 NOTE — Telephone Encounter (Signed)
Neurosurgery referral placed.

## 2017-06-05 ENCOUNTER — Ambulatory Visit: Payer: Self-pay | Admitting: Family Medicine

## 2017-07-03 ENCOUNTER — Other Ambulatory Visit: Payer: Self-pay | Admitting: Physician Assistant

## 2017-07-03 DIAGNOSIS — K2100 Gastro-esophageal reflux disease with esophagitis, without bleeding: Secondary | ICD-10-CM

## 2017-07-07 ENCOUNTER — Other Ambulatory Visit: Payer: Self-pay | Admitting: Physician Assistant

## 2017-07-07 DIAGNOSIS — F331 Major depressive disorder, recurrent, moderate: Secondary | ICD-10-CM

## 2017-08-06 ENCOUNTER — Other Ambulatory Visit: Payer: Self-pay | Admitting: Physician Assistant

## 2017-10-23 ENCOUNTER — Telehealth: Payer: Self-pay

## 2017-10-23 ENCOUNTER — Other Ambulatory Visit: Payer: Self-pay | Admitting: Physician Assistant

## 2017-10-23 DIAGNOSIS — K900 Celiac disease: Secondary | ICD-10-CM

## 2017-10-23 MED ORDER — MUPIROCIN 2 % EX OINT: TOPICAL_OINTMENT | CUTANEOUS | 3 refills | Status: DC

## 2017-10-23 MED ORDER — TRIAMCINOLONE ACETONIDE 0.1 % EX CREA: 1.0000 "application " | TOPICAL_CREAM | Freq: Two times a day (BID) | CUTANEOUS | 1 refills | Status: DC

## 2017-10-23 NOTE — Telephone Encounter (Signed)
Sent bactroban.

## 2017-10-23 NOTE — Telephone Encounter (Signed)
I sent triamcinolone cream to pharmacy.

## 2017-10-23 NOTE — Telephone Encounter (Signed)
Kenneth Duncan wife called and states that he has a rash on his neck. He was treated in the past with two different creams. She states it is part of his celiac disease and dermatitis. Please advise.

## 2017-10-23 NOTE — Telephone Encounter (Signed)
Kenneth Duncan would also like the Bactroban sent in.

## 2017-10-24 ENCOUNTER — Other Ambulatory Visit: Payer: Self-pay | Admitting: Physician Assistant

## 2017-10-24 DIAGNOSIS — F331 Major depressive disorder, recurrent, moderate: Secondary | ICD-10-CM

## 2017-11-02 ENCOUNTER — Other Ambulatory Visit: Payer: Self-pay | Admitting: Physician Assistant

## 2017-11-02 DIAGNOSIS — I10 Essential (primary) hypertension: Secondary | ICD-10-CM

## 2017-11-17 ENCOUNTER — Other Ambulatory Visit: Payer: Self-pay | Admitting: Physician Assistant

## 2017-11-20 ENCOUNTER — Other Ambulatory Visit: Payer: Self-pay | Admitting: Physician Assistant

## 2017-11-20 DIAGNOSIS — I10 Essential (primary) hypertension: Secondary | ICD-10-CM

## 2017-11-23 ENCOUNTER — Telehealth: Payer: Self-pay

## 2017-11-23 MED ORDER — OLMESARTAN MEDOXOMIL 20 MG PO TABS: 20.0000 mg | ORAL_TABLET | Freq: Every day | ORAL | 1 refills | Status: DC

## 2017-11-23 NOTE — Telephone Encounter (Signed)
Lisinopril off of pt's med list.   Spoke to [facility]- they do not have in stock. Due to recall on Irbesartan recall it has been put on backorder and all pharmacies are having trouble ordering medication.   When I spoke to [facility] they did not have any in stock and do not know when they will have any in stock   Please advise on what pt should do

## 2017-11-23 NOTE — Telephone Encounter (Signed)
Only certain called because of a manufacturing issue it was not because of a problem with the drug itself.  Please call the pharmacy and see if they have it in stock.  If He could not take the other one that we need to take it off his med list.

## 2017-11-23 NOTE — Telephone Encounter (Signed)
Prescription sent for generic Benicar instead.

## 2017-11-23 NOTE — Telephone Encounter (Signed)
Pt was recently changed from Lisinopril to Irbesartan due to allergy. Pt's wife called stating that she is not comfortable with pt taking this since it was recently recalled, and she also reports having trouble finding this medication at ay [facility] pharmacies.   Wanting to know if BP medication can be changed?  Please advise

## 2017-11-24 NOTE — Telephone Encounter (Signed)
Called and advised pt wife. Pt has been scheduled with a follow up for Jade at the end of the month to have his BP check and have med check.  No further needs at this time.

## 2017-12-15 ENCOUNTER — Ambulatory Visit: Payer: Self-pay | Admitting: Physician Assistant

## 2018-01-12 ENCOUNTER — Other Ambulatory Visit: Payer: Self-pay | Admitting: Family Medicine

## 2018-01-12 ENCOUNTER — Other Ambulatory Visit: Payer: Self-pay | Admitting: Physician Assistant

## 2018-01-12 DIAGNOSIS — F331 Major depressive disorder, recurrent, moderate: Secondary | ICD-10-CM

## 2018-03-09 ENCOUNTER — Other Ambulatory Visit: Payer: Self-pay | Admitting: Physician Assistant

## 2018-03-09 DIAGNOSIS — F331 Major depressive disorder, recurrent, moderate: Secondary | ICD-10-CM

## 2018-03-20 ENCOUNTER — Other Ambulatory Visit: Payer: Self-pay

## 2018-03-20 DIAGNOSIS — K900 Celiac disease: Secondary | ICD-10-CM

## 2018-03-20 MED ORDER — DAPSONE 100 MG PO TABS: 100.0000 mg | ORAL_TABLET | Freq: Two times a day (BID) | ORAL | 3 refills | Status: DC

## 2018-04-17 ENCOUNTER — Other Ambulatory Visit: Payer: Self-pay

## 2018-04-17 MED ORDER — OLMESARTAN MEDOXOMIL 20 MG PO TABS: 20.0000 mg | ORAL_TABLET | Freq: Every day | ORAL | 0 refills | Status: DC

## 2018-05-11 ENCOUNTER — Other Ambulatory Visit: Payer: Self-pay | Admitting: Physician Assistant

## 2018-05-11 DIAGNOSIS — F331 Major depressive disorder, recurrent, moderate: Secondary | ICD-10-CM

## 2018-05-12 ENCOUNTER — Other Ambulatory Visit: Payer: Self-pay | Admitting: Physician Assistant

## 2018-05-14 ENCOUNTER — Ambulatory Visit: Payer: Self-pay | Admitting: Physician Assistant

## 2018-05-18 ENCOUNTER — Ambulatory Visit: Payer: BLUE CROSS/BLUE SHIELD | Admitting: Physician Assistant

## 2018-05-18 ENCOUNTER — Other Ambulatory Visit: Payer: Self-pay

## 2018-05-18 ENCOUNTER — Encounter: Payer: Self-pay | Admitting: Physician Assistant

## 2018-05-18 VITALS — BP 153/88 | HR 64 | Ht 73.0 in | Wt 300.0 lb

## 2018-05-18 DIAGNOSIS — K2100 Gastro-esophageal reflux disease with esophagitis, without bleeding: Secondary | ICD-10-CM

## 2018-05-18 DIAGNOSIS — Z13220 Encounter for screening for lipoid disorders: Secondary | ICD-10-CM

## 2018-05-18 DIAGNOSIS — F331 Major depressive disorder, recurrent, moderate: Secondary | ICD-10-CM

## 2018-05-18 DIAGNOSIS — G8929 Other chronic pain: Secondary | ICD-10-CM

## 2018-05-18 DIAGNOSIS — Z131 Encounter for screening for diabetes mellitus: Secondary | ICD-10-CM

## 2018-05-18 DIAGNOSIS — K900 Celiac disease: Secondary | ICD-10-CM | POA: Diagnosis not present

## 2018-05-18 DIAGNOSIS — E559 Vitamin D deficiency, unspecified: Secondary | ICD-10-CM

## 2018-05-18 DIAGNOSIS — E538 Deficiency of other specified B group vitamins: Secondary | ICD-10-CM

## 2018-05-18 DIAGNOSIS — Z125 Encounter for screening for malignant neoplasm of prostate: Secondary | ICD-10-CM

## 2018-05-18 DIAGNOSIS — J302 Other seasonal allergic rhinitis: Secondary | ICD-10-CM

## 2018-05-18 DIAGNOSIS — M25561 Pain in right knee: Secondary | ICD-10-CM

## 2018-05-18 DIAGNOSIS — I10 Essential (primary) hypertension: Secondary | ICD-10-CM | POA: Diagnosis not present

## 2018-05-18 DIAGNOSIS — M25562 Pain in left knee: Secondary | ICD-10-CM

## 2018-05-18 MED ORDER — IBUPROFEN 800 MG PO TABS: 800.0000 mg | ORAL_TABLET | Freq: Three times a day (TID) | ORAL | 2 refills | Status: DC | PRN

## 2018-05-18 MED ORDER — CELECOXIB 200 MG PO CAPS: ORAL_CAPSULE | ORAL | 2 refills | Status: DC

## 2018-05-18 MED ORDER — OLMESARTAN MEDOXOMIL 40 MG PO TABS: 40.0000 mg | ORAL_TABLET | Freq: Every day | ORAL | 3 refills | Status: DC

## 2018-05-18 MED ORDER — DAPSONE 100 MG PO TABS: 100.0000 mg | ORAL_TABLET | Freq: Two times a day (BID) | ORAL | 3 refills | Status: DC

## 2018-05-18 MED ORDER — ESCITALOPRAM OXALATE 20 MG PO TABS: 20.0000 mg | ORAL_TABLET | Freq: Every day | ORAL | 5 refills | Status: DC

## 2018-05-18 MED ORDER — OMEPRAZOLE 20 MG PO CPDR: 20.0000 mg | DELAYED_RELEASE_CAPSULE | Freq: Every day | ORAL | 3 refills | Status: DC

## 2018-05-18 MED ORDER — CYANOCOBALAMIN 1000 MCG/ML IJ SOLN: 1000.0000 ug | INTRAMUSCULAR | 0 refills | Status: DC

## 2018-05-18 MED ORDER — AMBULATORY NON FORMULARY MEDICATION: 1 refills | Status: AC

## 2018-05-18 MED ORDER — ESCITALOPRAM OXALATE 20 MG PO TABS: 20.0000 mg | ORAL_TABLET | Freq: Every day | ORAL | 4 refills | Status: DC

## 2018-05-18 MED ORDER — FLUTICASONE PROPIONATE 50 MCG/ACT NA SUSP: NASAL | 4 refills | Status: DC

## 2018-05-18 NOTE — Progress Notes (Signed)
l °

## 2018-05-18 NOTE — Patient Instructions (Addendum)
Increase Benicar 28m. Recheck 1 month.   Saw Palmetto, Serenoa repens tablets and capsules What is this medicine? SAW PALMETTO (saw pal MET oh) is an herbal or dietary supplement. It is promoted to help support prostate health. The FDA has not approved this supplement for any medical use. This supplement may be used for other purposes; ask your health care provider or pharmacist if you have questions. This medicine may be used for other purposes; ask your health care provider or pharmacist if you have questions. What should I tell my health care provider before I take this medicine? They need to know if you have any of these conditions: -liver disease -prostate cancer -an unusual or allergic reaction to saw palmetto, other herbs, plants, medicines, foods, dyes, or preservatives -pregnant or trying to get pregnant -breast-feeding How should I use this medicine? Take this supplement by mouth with a glass of water. Follow the directions on the package labeling, or take as directed by your health care professional. If this supplement upsets your stomach, take it with food. Do not take this supplement more often than directed. Contact your pediatrician regarding the use of this supplement in children. Special care may be needed. Overdosage: If you think you have taken too much of this medicine contact a poison control center or emergency room at once. NOTE: This medicine is only for you. Do not share this medicine with others. What if I miss a dose? If you miss a dose, take it as soon as you can. If it is almost time for your next dose, take only that dose. Do not take double or extra doses. What may interact with this medicine? -other medicines for the prostate like finasteride, tamsulosin -hormones -warfarin This list may not describe all possible interactions. Give your health care provider a list of all the medicines, herbs, non-prescription drugs, or dietary supplements you use. Also tell  them if you smoke, drink alcohol, or use illegal drugs. Some items may interact with your medicine. What should I watch for while using this medicine? Tell your doctor or healthcare professional if your symptoms do not start to get better or if they get worse. If you are scheduled for any medical or dental procedure, tell your healthcare provider that you are taking this supplement. You may need to stop taking this supplement before the procedure. Herbal or dietary supplements are not regulated like medicines. Rigid quality control standards are not required for dietary supplements. The purity and strength of these products can vary. The safety and effect of this dietary supplement for a certain disease or illness is not well known. This product is not intended to diagnose, treat, cure or prevent any disease. The Food and Drug Administration suggests the following to help consumers protect themselves: -Always read product labels and follow directions. -Natural does not mean a product is safe for humans to take. -Look for products that include USP after the ingredient name. This means that the manufacturer followed the standards of the [place]. -Supplements made or sold by a nationally known food or drug company are more likely to be made under tight controls. You can write to the company for more information about how the product was made. What side effects may I notice from receiving this medicine? Side effects that you should report to your doctor or health care professional as soon as possible: -allergic reactions like skin rash, itching or hives, swelling of the face, lips, or tongue -breathing problems -dark urine -fever -general  ill feeling or flu-like symptoms -light-colored stools -loss of appetite, nausea -right upper belly pain -trouble passing urine or change in the amount of urine -unusually weak or tired -yellowing of the eyes or skin Side effects that usually do not  require medical attention (report to your doctor or health care professional if they continue or are bothersome): -breast tenderness or enlargement -diarrhea -headache -stomach upset This list may not describe all possible side effects. Call your doctor for medical advice about side effects. You may report side effects to FDA at 1-800-FDA-1088. Where should I keep my medicine? Keep out of the reach of children. Store at room temperature between 15 and 30 degrees C (59 and 86 degrees F) or as directed on the package label. Protect from moisture. Throw away any unused supplement after the expiration date. NOTE: This sheet is a summary. It may not cover all possible information. If you have questions about this medicine, talk to your doctor, pharmacist, or health care provider.  2019 Elsevier/Gold Standard (2007-12-06 15:15:00)

## 2018-05-25 NOTE — Progress Notes (Signed)
Subjective:    Patient ID: Kenneth Duncan, male    DOB: 1956/03/15, 63 y.o.   MRN: [id number]  HPI Pt is a 63 yo male with HTN, celiac disease, knee pain, depression who presents to the clinic for medication refill.   MDD- ok. No problems or concerns. Taking lexapro.   Chronic knee pain using as needed ibuprofen. On feet all day. Would like to try something else.   HTN- no CP, palpitations, headaches or vision changes.   He does mention getting up at night to urinate. Stopped flomax.   .. Active Ambulatory Problems    Diagnosis Date Noted  . Chronic low back pain 06/14/2011  . Degenerative disc disease, lumbar 06/14/2011  . Hypertension 06/14/2011  . GERD (gastroesophageal reflux disease) 06/14/2011  . Anxiety disorder due to general medical condition 06/14/2011  . Celiac disease 06/14/2011  . B12 deficiency 07/28/2012  . Herpetiformis dermatitis 07/28/2012  . Hemorrhoid prolapse 08/15/2013  . OSA (obstructive sleep apnea) 08/24/2015  . Benign prostatic hyperplasia 10/21/2015  . No energy 10/21/2015  . Elevated LDL cholesterol level 10/28/2015  . Vitamin D deficiency 10/28/2015  . History of colon polyps 11/01/2016  . Angio-edema 03/07/2017  . Chronic SI joint pain 05/21/2017  . Lumbar herniated disc 05/23/2017  . Lumbar foraminal stenosis 05/23/2017   Resolved Ambulatory Problems    Diagnosis Date Noted  . No Resolved Ambulatory Problems   No Additional Past Medical History      Review of Systems  All other systems reviewed and are negative.      Objective:   Physical Exam Vitals signs reviewed.  Constitutional:      Appearance: Normal appearance.  HENT:     Head: Normocephalic and atraumatic.  Cardiovascular:     Rate and Rhythm: Normal rate and regular rhythm.  Pulmonary:     Effort: Pulmonary effort is normal.     Breath sounds: Normal breath sounds.  Neurological:     General: No focal deficit present.     Mental Status: He is alert and  oriented to person, place, and time.  Psychiatric:        Mood and Affect: Mood normal.        Behavior: Behavior normal.           Assessment & Plan:  Marland KitchenMarland KitchenIthiel was seen today for follow-up.  Diagnoses and all orders for this visit:  Essential hypertension -     cyanocobalamin (,VITAMIN B-12,) 1000 MCG/ML injection; Inject 1 mL (1,000 mcg total) into the muscle every 30 (thirty) days. Patient needs to schedule a follow up appointment. -     olmesartan (BENICAR) 40 MG tablet; Take 1 tablet (40 mg total) by mouth daily.  Moderate episode of recurrent major depressive disorder (HCC) -     cyanocobalamin (,VITAMIN B-12,) 1000 MCG/ML injection; Inject 1 mL (1,000 mcg total) into the muscle every 30 (thirty) days. Patient needs to schedule a follow up appointment. -     escitalopram (LEXAPRO) 20 MG tablet; Take 1 tablet (20 mg total) by mouth daily.  Gastroesophageal reflux disease with esophagitis -     cyanocobalamin (,VITAMIN B-12,) 1000 MCG/ML injection; Inject 1 mL (1,000 mcg total) into the muscle every 30 (thirty) days. Patient needs to schedule a follow up appointment. -     omeprazole (PRILOSEC) 20 MG capsule; Take 1 capsule (20 mg total) by mouth daily.  Celiac disease -     cyanocobalamin (,VITAMIN B-12,) 1000 MCG/ML injection; Inject 1 mL (1,000  mcg total) into the muscle every 30 (thirty) days. Patient needs to schedule a follow up appointment. -     dapsone 100 MG tablet; Take 1 tablet (100 mg total) by mouth 2 (two) times daily.  Screening for lipid disorders -     cyanocobalamin (,VITAMIN B-12,) 1000 MCG/ML injection; Inject 1 mL (1,000 mcg total) into the muscle every 30 (thirty) days. Patient needs to schedule a follow up appointment. -     Lipid Panel w/reflex Direct LDL  Screening for diabetes mellitus -     cyanocobalamin (,VITAMIN B-12,) 1000 MCG/ML injection; Inject 1 mL (1,000 mcg total) into the muscle every 30 (thirty) days. Patient needs to schedule a follow  up appointment. -     COMPLETE METABOLIC PANEL WITH GFR  X28 deficiency -     AMBULATORY NON FORMULARY MEDICATION; Syringes and 1" 25g needles for B12 injections -     cyanocobalamin (,VITAMIN B-12,) 1000 MCG/ML injection; Inject 1 mL (1,000 mcg total) into the muscle every 30 (thirty) days. Patient needs to schedule a follow up appointment. -     B12 and Folate Panel  Vitamin D insufficiency -     cyanocobalamin (,VITAMIN B-12,) 1000 MCG/ML injection; Inject 1 mL (1,000 mcg total) into the muscle every 30 (thirty) days. Patient needs to schedule a follow up appointment. -     VITAMIN D 25 Hydroxy (Vit-D Deficiency, Fractures)  Prostate cancer screening -     cyanocobalamin (,VITAMIN B-12,) 1000 MCG/ML injection; Inject 1 mL (1,000 mcg total) into the muscle every 30 (thirty) days. Patient needs to schedule a follow up appointment. -     PSA  Seasonal allergies -     cyanocobalamin (,VITAMIN B-12,) 1000 MCG/ML injection; Inject 1 mL (1,000 mcg total) into the muscle every 30 (thirty) days. Patient needs to schedule a follow up appointment. -     fluticasone (FLONASE) 50 MCG/ACT nasal spray; INSTILL ONE SPRAY IN EACH NOSTRIL ONE TIME DAILY  Chronic pain of both knees -     ibuprofen (ADVIL,MOTRIN) 800 MG tablet; Take 1 tablet (800 mg total) by mouth every 8 (eight) hours as needed. -     ibuprofen (ADVIL,MOTRIN) 800 MG tablet; Take 1 tablet (800 mg total) by mouth every 8 (eight) hours as needed. -     celecoxib (CELEBREX) 200 MG capsule; One to 2 tablets by mouth daily as needed for pain.   .. Depression screen [facility] 2/9 05/17/2017 07/08/2016  Decreased Interest 0 0  Down, Depressed, Hopeless 0 0  PHQ - 2 Score 0 0   Refilled medications today.   BP not to goal increased benicar to 94m follow up in 4 weeks.   Fasting labs ordered.   Getting up to urinate at night. Discussed saw palmetto to start to see if improves symptoms.   Start celebrex for chronic knee pain.

## 2018-06-12 ENCOUNTER — Telehealth: Payer: Self-pay | Admitting: Physician Assistant

## 2018-06-12 NOTE — Telephone Encounter (Signed)
Patient wife has called and states that Cyncere has taken a few of the Celebrex and it was making him feel like he was in a coma and tired all the time. The wife reports that he will not take any more of this medication and wants to know what you recommend. He said that he is taking Ibuprofen 800 mg and its working better than Celebrex. He is also supposed to get labs and wants to know what needs to be omitted from the orders since the cost goes toward the deductible. Please advise.

## 2018-06-13 NOTE — Telephone Encounter (Signed)
Ok please add celebrex to intolerance list.   Does he need rx for ibuprofen 865m?   Ok to order lipid, cmp, psa....those are the bare minimum and should make it cheaper.

## 2018-06-14 NOTE — Addendum Note (Signed)
Addended by: Dessie Coma on: 06/14/2018 09:36 AM   Modules accepted: Orders

## 2018-06-14 NOTE — Telephone Encounter (Signed)
I spoke with wife and she wants to have one of our people draw her blood since she is on a fixed income. She does not have the money to pay for the labs out of pocket at this time. Please advise.    Patient does not need Ibuprofen at this time and will call when a refill is needed.

## 2018-06-15 NOTE — Telephone Encounter (Signed)
Can you call this patients wife to set up a nurse visit for a blood draw per Luvenia Starch?

## 2018-06-15 NOTE — Telephone Encounter (Signed)
[place] if we can set her up nurse visit with angela for blood draw.

## 2018-06-15 NOTE — Telephone Encounter (Signed)
Left message advising

## 2018-06-15 NOTE — Telephone Encounter (Signed)
Wife, Mrs.Loconte, wanted to know how much it might be, if there will still be a co-pay. She wanted to know where we will send the labs and if its just better for them to go downstairs.Would like a call back later today or even Monday.

## 2018-06-15 NOTE — Telephone Encounter (Signed)
We will wave nurse visit but still will be the cost of the labs and only quest will know that!

## 2018-06-22 DIAGNOSIS — Z125 Encounter for screening for malignant neoplasm of prostate: Secondary | ICD-10-CM | POA: Diagnosis not present

## 2018-06-22 DIAGNOSIS — E538 Deficiency of other specified B group vitamins: Secondary | ICD-10-CM | POA: Diagnosis not present

## 2018-06-22 DIAGNOSIS — E559 Vitamin D deficiency, unspecified: Secondary | ICD-10-CM | POA: Diagnosis not present

## 2018-06-22 DIAGNOSIS — Z13220 Encounter for screening for lipoid disorders: Secondary | ICD-10-CM | POA: Diagnosis not present

## 2018-06-23 LAB — PSA: PSA: 0.4 ng/mL (ref <=4.0)

## 2018-06-23 LAB — COMPLETE METABOLIC PANEL WITH GFR
AG Ratio: 2 (calc) (ref 1.0–2.5)
ALT: 59 U/L — ABNORMAL HIGH (ref 9–46)
AST: 39 U/L — ABNORMAL HIGH (ref 10–35)
Albumin: 4.3 g/dL (ref 3.6–5.1)
Alkaline phosphatase (APISO): 73 U/L (ref 35–144)
BILIRUBIN TOTAL: 0.9 mg/dL (ref 0.2–1.2)
BUN: 18 mg/dL (ref 7–25)
CO2: 27 mmol/L (ref 20–32)
Calcium: 9 mg/dL (ref 8.6–10.3)
Chloride: 105 mmol/L (ref 98–110)
Creat: 1.17 mg/dL (ref 0.70–1.25)
GFR, Est African American: 76 mL/min/{1.73_m2} (ref >=60)
GFR, Est Non African American: 66 mL/min/{1.73_m2} (ref >=60)
Globulin: 2.2 g/dL (calc) (ref 1.9–3.7)
Glucose, Bld: 100 mg/dL — ABNORMAL HIGH (ref 65–99)
Potassium: 4.4 mmol/L (ref 3.5–5.3)
Sodium: 140 mmol/L (ref 135–146)
Total Protein: 6.5 g/dL (ref 6.1–8.1)

## 2018-06-23 LAB — LIPID PANEL W/REFLEX DIRECT LDL
Cholesterol: 212 mg/dL — ABNORMAL HIGH (ref <200)
HDL: 41 mg/dL (ref >=40)
LDL Cholesterol (Calc): 134 mg/dL (calc) — ABNORMAL HIGH
Non-HDL Cholesterol (Calc): 171 mg/dL (calc) — ABNORMAL HIGH (ref <130)
TRIGLYCERIDES: 230 mg/dL — AB (ref <150)
Total CHOL/HDL Ratio: 5.2 (calc) — ABNORMAL HIGH (ref <5.0)

## 2018-06-23 LAB — B12 AND FOLATE PANEL
Folate: 4 ng/mL — ABNORMAL LOW
Vitamin B-12: 330 pg/mL (ref 200–1100)

## 2018-06-23 LAB — VITAMIN D 25 HYDROXY (VIT D DEFICIENCY, FRACTURES): Vit D, 25-Hydroxy: 15 ng/mL — ABNORMAL LOW (ref 30–100)

## 2018-06-26 ENCOUNTER — Other Ambulatory Visit: Payer: Self-pay | Admitting: Physician Assistant

## 2018-06-26 MED ORDER — VITAMIN D (ERGOCALCIFEROL) 1.25 MG (50000 UNIT) PO CAPS: 50000.0000 [IU] | ORAL_CAPSULE | ORAL | 0 refills | Status: DC

## 2018-06-26 NOTE — Progress Notes (Signed)
Call pt: TG and LDL are elevated. I think it is time to start cholesterol reducing agent. What are your thoughts?   Liver enzymes are a little bit elevated. Have you been drinking more alcohol? Have you been taking tylenol?   Prostate looks good.   Vitamin D is way low. Need to be on high dose. Will send over weekly.   Folate a little low. Are you taking any folate?

## 2018-07-06 ENCOUNTER — Other Ambulatory Visit: Payer: Self-pay

## 2018-07-06 DIAGNOSIS — M25562 Pain in left knee: Principal | ICD-10-CM

## 2018-07-06 DIAGNOSIS — G8929 Other chronic pain: Secondary | ICD-10-CM

## 2018-07-06 DIAGNOSIS — M25561 Pain in right knee: Principal | ICD-10-CM

## 2018-07-06 DIAGNOSIS — J302 Other seasonal allergic rhinitis: Secondary | ICD-10-CM

## 2018-07-06 MED ORDER — FLUTICASONE PROPIONATE 50 MCG/ACT NA SUSP: NASAL | 2 refills | Status: DC

## 2018-07-06 MED ORDER — IBUPROFEN 800 MG PO TABS: 800.0000 mg | ORAL_TABLET | Freq: Three times a day (TID) | ORAL | 2 refills | Status: DC | PRN

## 2018-08-02 ENCOUNTER — Other Ambulatory Visit: Payer: Self-pay

## 2018-08-02 DIAGNOSIS — J302 Other seasonal allergic rhinitis: Secondary | ICD-10-CM

## 2018-08-02 MED ORDER — FLUTICASONE PROPIONATE 50 MCG/ACT NA SUSP: NASAL | 1 refills | Status: DC

## 2018-08-02 NOTE — Telephone Encounter (Signed)
Requesting an RX be sent for #3 bottle of fluticasone be sent to mail order. Previous RX sent for 1 bottle (60 day supply).  Will send RX for 3 bottles, per pt wife request. Previous RX cancelled with [place] at [facility].

## 2018-08-09 ENCOUNTER — Telehealth: Payer: Self-pay | Admitting: Neurology

## 2018-08-09 NOTE — Telephone Encounter (Signed)
Received note from [place] recommending the addition of a statin drug. Patient had labs performed on 06/22/2018 with elevated LDL. He was contact from our office to recommend starting a cholesterol reducing agent, but wife had stated he was out of town and would have him call us when he returned. Never received a call back. Per Luvenia Starch - call to check up on status. Left message on machine for patient to call back.

## 2018-08-10 MED ORDER — ATORVASTATIN CALCIUM 20 MG PO TABS: 20.0000 mg | ORAL_TABLET | Freq: Every day | ORAL | 3 refills | Status: DC

## 2018-08-10 NOTE — Telephone Encounter (Signed)
Called pt and he states that he would be okay to start a statin. Requesting RX be sent to [facility]

## 2018-08-10 NOTE — Telephone Encounter (Signed)
Done

## 2018-09-01 ENCOUNTER — Other Ambulatory Visit: Payer: Self-pay | Admitting: Physician Assistant

## 2018-09-01 DIAGNOSIS — F331 Major depressive disorder, recurrent, moderate: Secondary | ICD-10-CM

## 2018-09-16 ENCOUNTER — Other Ambulatory Visit: Payer: Self-pay | Admitting: Physician Assistant

## 2018-09-27 DIAGNOSIS — R309 Painful micturition, unspecified: Secondary | ICD-10-CM | POA: Diagnosis not present

## 2018-09-27 DIAGNOSIS — M545 Low back pain: Secondary | ICD-10-CM | POA: Diagnosis not present

## 2018-10-01 ENCOUNTER — Telehealth: Payer: Self-pay | Admitting: Neurology

## 2018-10-01 ENCOUNTER — Encounter: Payer: Self-pay | Admitting: Physician Assistant

## 2018-10-01 ENCOUNTER — Ambulatory Visit (INDEPENDENT_AMBULATORY_CARE_PROVIDER_SITE_OTHER): Payer: Worker's Compensation | Admitting: Physician Assistant

## 2018-10-01 VITALS — Temp 98.7°F | Ht 74.0 in | Wt 296.0 lb

## 2018-10-01 DIAGNOSIS — M5126 Other intervertebral disc displacement, lumbar region: Secondary | ICD-10-CM

## 2018-10-01 DIAGNOSIS — M5441 Lumbago with sciatica, right side: Secondary | ICD-10-CM

## 2018-10-01 DIAGNOSIS — M48061 Spinal stenosis, lumbar region without neurogenic claudication: Secondary | ICD-10-CM | POA: Diagnosis not present

## 2018-10-01 DIAGNOSIS — G8929 Other chronic pain: Secondary | ICD-10-CM | POA: Diagnosis not present

## 2018-10-01 MED ORDER — OXYCODONE HCL 5 MG PO TABS: 5.0000 mg | ORAL_TABLET | Freq: Four times a day (QID) | ORAL | 0 refills | Status: DC | PRN

## 2018-10-01 MED ORDER — GABAPENTIN 300 MG PO CAPS: ORAL_CAPSULE | ORAL | 1 refills | Status: DC

## 2018-10-01 MED ORDER — METAXALONE 800 MG PO TABS: 800.0000 mg | ORAL_TABLET | Freq: Three times a day (TID) | ORAL | 1 refills | Status: DC

## 2018-10-01 NOTE — Progress Notes (Signed)
Patient ID: Kenneth Duncan, male   DOB: 01-26-1956, 63 y.o.   MRN: [id number] .Virtual Visit via Video Note  I connected with Kenneth Duncan on 10/02/18 at  2:40 PM EDT by a video enabled telemedicine application and verified that I am speaking with the correct person using two identifiers.  Location: Patient: home Provider: clinic   I discussed the limitations of evaluation and management by telemedicine and the availability of in person appointments. The patient expressed understanding and agreed to proceed.  History of Present Illness: Pt is a 63 yo male with long history of chronic back pain with right sided sciatica for the last 35yr who calls into the clinic with worsening pain. He has been getting steroid shots and most recently had a nerve ablation on 09/06/18 with Dr. OLyla Son 2 weeks later his back pain is the worst it has ever been. [facility]. He has been given morphine, oxycodone, gabapentin, decadron, flexeril and still in pain. Pain is a little better today. Denies any new injury. Radiates down right leg. No bowel or bowel dysfunction. No saddle anesthesia. MRI done on 6/14 with no significant changes. He has lumbar DDD and stenosis. He has appt with Dr. OLyla Sonon Wednesday. He is doing his exercises. He is not able to walk at times and not able to work.   .. Active Ambulatory Problems    Diagnosis Date Noted  . Chronic low back pain 06/14/2011  . Degenerative disc disease, lumbar 06/14/2011  . Hypertension 06/14/2011  . GERD (gastroesophageal reflux disease) 06/14/2011  . Anxiety disorder due to general medical condition 06/14/2011  . Celiac disease 06/14/2011  . B12 deficiency 07/28/2012  . Herpetiformis dermatitis 07/28/2012  . Hemorrhoid prolapse 08/15/2013  . OSA (obstructive sleep apnea) 08/24/2015  . Benign prostatic hyperplasia 10/21/2015  . No energy 10/21/2015  . Elevated LDL cholesterol level 10/28/2015  . Vitamin D deficiency  10/28/2015  . History of colon polyps 11/01/2016  . Angio-edema 03/07/2017  . Chronic SI joint pain 05/21/2017  . Lumbar herniated disc 05/23/2017  . Lumbar foraminal stenosis 05/23/2017   Resolved Ambulatory Problems    Diagnosis Date Noted  . No Resolved Ambulatory Problems   No Additional Past Medical History   Reviewed med, allergy, problem list.    Observations/Objective: Does appear to be in pain.  Cannot sit still with very limited ROM at waist. Seems to reposition frequently.    .. Today's Vitals   10/01/18 1414  Temp: 98.7 F (37.1 C)  TempSrc: Oral  Weight: 296 lb (134.3 kg)  Height: 6' 2"  (1.88 m)   Body mass index is 38 kg/m.    Assessment and Plan: .Marland KitchenMarland KitchenKennettwas seen today for back pain.  Diagnoses and all orders for this visit:  Chronic bilateral low back pain with right-sided sciatica -     oxyCODONE (OXY IR/ROXICODONE) 5 MG immediate release tablet; Take 1 tablet (5 mg total) by mouth every 6 (six) hours as needed for up to 3 days for severe pain. -     gabapentin (NEURONTIN) 300 MG capsule; Take one tablet three times a day. -     metaxalone (SKELAXIN) 800 MG tablet; Take 1 tablet (800 mg total) by mouth 3 (three) times daily.  Lumbar foraminal stenosis -     oxyCODONE (OXY IR/ROXICODONE) 5 MG immediate release tablet; Take 1 tablet (5 mg total) by mouth every 6 (six) hours as needed for up to 3 days for  severe pain. -     gabapentin (NEURONTIN) 300 MG capsule; Take one tablet three times a day. -     metaxalone (SKELAXIN) 800 MG tablet; Take 1 tablet (800 mg total) by mouth 3 (three) times daily.  Lumbar herniated disc -     oxyCODONE (OXY IR/ROXICODONE) 5 MG immediate release tablet; Take 1 tablet (5 mg total) by mouth every 6 (six) hours as needed for up to 3 days for severe pain. -     gabapentin (NEURONTIN) 300 MG capsule; Take one tablet three times a day. -     metaxalone (SKELAXIN) 800 MG tablet; Take 1 tablet (800 mg total) by mouth 3  (three) times daily.  does not appear to be any red flags with back pain. 6/14 MRI was stable as well. He continues to have significant pain.  Today will increase gabapentin to 328m TID.  Stop flexeril and start skelaxin.  NSAIDs as needed if helping.  Finish decadron.  Discussed conservative treatment with tens unit, low back exercises with you tube BMikki Santeeand Brad, icy hot patches and tens unit. Sent order for tens unit. Written out of work through Friday. Has appt with Dr. OLyla Sonon Wednesday. I will given more opioids until that appt on Wednesday. Oxycodone sent for moderate to severe acute pain.     .Marland KitchenPDMP reviewed during this encounter.  No concerns.   Follow Up Instructions:    I discussed the assessment and treatment plan with the patient. The patient was provided an opportunity to ask questions and all were answered. The patient agreed with the plan and demonstrated an understanding of the instructions.   The patient was advised to call back or seek an in-person evaluation if the symptoms worsen or if the condition fails to improve as anticipated.     JIran Planas PA-C

## 2018-10-01 NOTE — Telephone Encounter (Signed)
Patient's wife called back after his visit today and left vm. She wanted to let us know that patient's brother recently went to the hospital with what he thought was kidney stones and ended up having an aneurysm and dying within a week. They are concerned something more serious is going on than back pain with patient and she is scared. Wanted to make you aware.

## 2018-10-01 NOTE — Progress Notes (Deleted)
Back pain started Thursday night. He has been to urgent care and ER. Given different injections, oxycodone, gabapentin, dexamethasone, flexeril. Still in pain. No injury.

## 2018-10-02 ENCOUNTER — Telehealth: Payer: Self-pay | Admitting: Physician Assistant

## 2018-10-02 MED ORDER — AMBULATORY NON FORMULARY MEDICATION: 5 refills | Status: AC

## 2018-10-02 NOTE — Telephone Encounter (Signed)
No screening for aneurysms is only encouraged in men 45-63 years old who have ever smoked. He has not smoked and not in age frame. So no.

## 2018-10-02 NOTE — Telephone Encounter (Signed)
I did speak with patient. Documented in encounter from today:   Spoke with patient's wife. She states he has calmed down and is avoiding ER for now. I advised on taking ibuprofen between dosages of Oxycodone since they are not currently doing this to try to get ahead of the pain. If he does have to go to ER advised they can mention family history of aneurysm and request CT. She states they actually learned more information today and his brother didn't die of the aneurysm, she is not as nervous and does believe this is all back pain.   They will call back if needed.   Would you like to order imaging? Wife doesn't seem as concerned today.

## 2018-10-02 NOTE — Telephone Encounter (Signed)
I do think his symptoms seem pretty consistent with back etiology. I was alerted he went to ED today. If they do not do any screening for abdominal aneurysm then let me know and I will order.

## 2018-10-02 NOTE — Telephone Encounter (Signed)
FYI: Mrs. Fraticelli called. She says her husband is still in a lot of pain and he's been pushing her to give him his pain med before the time. She will be taking him to the ER again to get another morphine shot because he's not acting like himself at all.  Thanks

## 2018-10-02 NOTE — Telephone Encounter (Signed)
Im sorry he is in so much pain. I would express concern of family hx of aneurysm so if warranted can do CT of abdomen in ED today.

## 2018-10-02 NOTE — Telephone Encounter (Signed)
Left pt's wife msg of recommendations.

## 2018-10-02 NOTE — Telephone Encounter (Signed)
Spoke with patient's wife. She states he has calmed down and is avoiding ER for now. I advised on taking ibuprofen between dosages of Oxycodone since they are not currently doing this to try to get ahead of the pain. If he does have to go to ER advised they can mention family history of aneurysm and request CT. She states they actually learned more information today and his brother didn't die of the aneurysm, she is not as nervous and does believe this is all back pain.   They will call back if needed.

## 2018-10-03 ENCOUNTER — Other Ambulatory Visit: Payer: Self-pay | Admitting: Neurology

## 2018-10-03 ENCOUNTER — Telehealth: Payer: Self-pay

## 2018-10-03 DIAGNOSIS — G8929 Other chronic pain: Secondary | ICD-10-CM

## 2018-10-03 DIAGNOSIS — M48061 Spinal stenosis, lumbar region without neurogenic claudication: Secondary | ICD-10-CM

## 2018-10-03 DIAGNOSIS — M5126 Other intervertebral disc displacement, lumbar region: Secondary | ICD-10-CM

## 2018-10-03 MED ORDER — OXYCODONE HCL 5 MG PO TABS: 5.0000 mg | ORAL_TABLET | ORAL | 0 refills | Status: DC | PRN

## 2018-10-03 NOTE — Telephone Encounter (Signed)
[facility] [employer] called for new RX for Oxycodone.  1. They wanted to verify quantity, since quantity gives meds for 7 days and directions state for 5 days.  2. They need supervising MD name/DEA number on RX.   Please advise.

## 2018-10-04 ENCOUNTER — Telehealth: Payer: Self-pay

## 2018-10-04 MED ORDER — OXYCODONE HCL 5 MG PO TABS: 5.0000 mg | ORAL_TABLET | ORAL | 0 refills | Status: DC | PRN

## 2018-10-04 NOTE — Telephone Encounter (Signed)
Not sure what is going on. They asked for your DEA which was on rx. You may just need to send it. Acute flare of his chronic low back pain. Sees Dr. Aundria Rud for management. Went to ED 3 times and I was giving him something for severe pain to keep out of ED until he could see Dr. Lyla Son.

## 2018-10-04 NOTE — Telephone Encounter (Signed)
Yes start 1-2 capfuls of miralax in 4oz of gaterade twice a day until stooling regularly.

## 2018-10-04 NOTE — Telephone Encounter (Signed)
Patient's wife called to let us know that patient has not had a bowel movement in a week and she is afraid that it may be increasing his back pain.   Patient's wife states she has given him Colace but it did not help. Confirmed with wife that they have Miralaxx, she reports she has an RX for it. Advised her to have patient start Miralaxx and call back if no improvement

## 2018-10-04 NOTE — Telephone Encounter (Signed)
Pt wife advised

## 2018-10-04 NOTE — Telephone Encounter (Signed)
Sent new rx

## 2018-10-04 NOTE — Telephone Encounter (Signed)
There is no possible way for me to cosign it if it sent electronically and if I do cosign it then we can fax it.  Do I need to send a new prescription for the pharmacy to fill it?

## 2018-10-09 ENCOUNTER — Encounter: Payer: Self-pay | Admitting: Physician Assistant

## 2018-10-09 ENCOUNTER — Ambulatory Visit (INDEPENDENT_AMBULATORY_CARE_PROVIDER_SITE_OTHER): Payer: Worker's Compensation | Admitting: Physician Assistant

## 2018-10-09 ENCOUNTER — Telehealth: Payer: Self-pay | Admitting: Physician Assistant

## 2018-10-09 VITALS — BP 93/58 | HR 87 | Ht 74.0 in | Wt 285.0 lb

## 2018-10-09 DIAGNOSIS — M48061 Spinal stenosis, lumbar region without neurogenic claudication: Secondary | ICD-10-CM

## 2018-10-09 DIAGNOSIS — M5136 Other intervertebral disc degeneration, lumbar region: Secondary | ICD-10-CM

## 2018-10-09 DIAGNOSIS — G5703 Lesion of sciatic nerve, bilateral lower limbs: Secondary | ICD-10-CM

## 2018-10-09 DIAGNOSIS — M5126 Other intervertebral disc displacement, lumbar region: Secondary | ICD-10-CM | POA: Diagnosis not present

## 2018-10-09 DIAGNOSIS — G8929 Other chronic pain: Secondary | ICD-10-CM

## 2018-10-09 DIAGNOSIS — M5441 Lumbago with sciatica, right side: Secondary | ICD-10-CM

## 2018-10-09 DIAGNOSIS — M533 Sacrococcygeal disorders, not elsewhere classified: Secondary | ICD-10-CM

## 2018-10-09 DIAGNOSIS — M51369 Other intervertebral disc degeneration, lumbar region without mention of lumbar back pain or lower extremity pain: Secondary | ICD-10-CM

## 2018-10-09 MED ORDER — CARISOPRODOL 350 MG PO TABS: 350.0000 mg | ORAL_TABLET | Freq: Three times a day (TID) | ORAL | 0 refills | Status: DC

## 2018-10-09 NOTE — Telephone Encounter (Signed)
Schedule for 2:20 and maybe we can get a game plan together. Please schedule.

## 2018-10-09 NOTE — Progress Notes (Signed)
Subjective:    Patient ID: Kenneth Duncan, male    DOB: 1955-07-12, 63 y.o.   MRN: [id number]  HPI  Pt is a 63 yo male with hx of lumbar DDD, lumbar herniated disc,chronic SI joint pain, and piriformis syndrome. For the last month he has been in severe pain. No new injury. Pain did start 2 weeks after ablation procedure. He is seeing Dr. Lyla Son.he was actually given bilateral injections in pirformis this afternoon before coming here. He has been to the hospital 2-3 times now seeking pain relief. The pain is severe 10/10 at times. He is not sleeping. He finds it difficult to walk or sit for longer periods of time. Denies any radiation into legs. He does have some bilateral leg weakness. He does report some loss of bladder control at times(MRI confirmed no cauda equina). He has lost 12lbs. He wakes up in sweats.  MRI last done did not show any significant changes from his baseline. He was referred to pain management. He cannot work and he is depressed and "desperate".   He is on gabapentin and lexapro. He has ibuprofen and some oxycodone.   He is accompanied by wife who is also frustrated.   . Active Ambulatory Problems    Diagnosis Date Noted  . Chronic low back pain 06/14/2011  . Degenerative disc disease, lumbar 06/14/2011  . Hypertension 06/14/2011  . GERD (gastroesophageal reflux disease) 06/14/2011  . Anxiety disorder due to general medical condition 06/14/2011  . Celiac disease 06/14/2011  . B12 deficiency 07/28/2012  . Herpetiformis dermatitis 07/28/2012  . Hemorrhoid prolapse 08/15/2013  . OSA (obstructive sleep apnea) 08/24/2015  . Benign prostatic hyperplasia 10/21/2015  . No energy 10/21/2015  . Elevated LDL cholesterol level 10/28/2015  . Vitamin D deficiency 10/28/2015  . History of colon polyps 11/01/2016  . Angio-edema 03/07/2017  . Chronic SI joint pain 05/21/2017  . Lumbar herniated disc 05/23/2017  . Lumbar foraminal stenosis 05/23/2017  . Piriformis syndrome of  both sides 10/09/2018   Resolved Ambulatory Problems    Diagnosis Date Noted  . No Resolved Ambulatory Problems   No Additional Past Medical History       Review of Systems See HPI.     Objective:   Physical Exam Vitals signs reviewed.  Constitutional:      General: He is in acute distress.     Appearance: He is diaphoretic.  Cardiovascular:     Rate and Rhythm: Normal rate and regular rhythm.  Musculoskeletal:     Comments: No ROM at waist.  Very tender over piriformis to palpation.  Little to no movement of bilateral legs without axial back pain.  Strength lower extremity 4/5.   Neurological:     General: No focal deficit present.     Mental Status: He is alert and oriented to person, place, and time.           Assessment & Plan:  Marland KitchenMarland KitchenZacharias was seen today for pain.  Diagnoses and all orders for this visit:  Piriformis syndrome of both sides -     carisoprodol (SOMA) 350 MG tablet; Take 1 tablet (350 mg total) by mouth 3 (three) times daily. -     Ambulatory referral to Chiropractic -     Ambulatory referral to Physical Therapy  Degenerative disc disease, lumbar -     carisoprodol (SOMA) 350 MG tablet; Take 1 tablet (350 mg total) by mouth 3 (three) times daily. -     Ambulatory referral to Chiropractic -  Ambulatory referral to Physical Therapy  Lumbar foraminal stenosis -     carisoprodol (SOMA) 350 MG tablet; Take 1 tablet (350 mg total) by mouth 3 (three) times daily. -     Ambulatory referral to Chiropractic -     Ambulatory referral to Physical Therapy  Lumbar herniated disc -     carisoprodol (SOMA) 350 MG tablet; Take 1 tablet (350 mg total) by mouth 3 (three) times daily. -     Ambulatory referral to Chiropractic -     Ambulatory referral to Physical Therapy  Chronic SI joint pain -     carisoprodol (SOMA) 350 MG tablet; Take 1 tablet (350 mg total) by mouth 3 (three) times daily. -     Ambulatory referral to Chiropractic -     Ambulatory  referral to Physical Therapy  Chronic bilateral low back pain with right-sided sciatica -     carisoprodol (SOMA) 350 MG tablet; Take 1 tablet (350 mg total) by mouth 3 (three) times daily. -     Ambulatory referral to Chiropractic -     Ambulatory referral to Physical Therapy   Discussed 2nd opinion due to severe pain. Will make referral.  Would like to restart PT to specifically target piriformis.  Referral also made to chiropractic care.  Stop all other muscle relaxers. Try soma. Sedation warning discussed. Pt aware to NOT take with oxycodone. Discussed use oxycodone very sparingly due to dependence risk. Use tylenol and ibuprofen with break through oxycodone.   Increase gabapentin up to 915m three times a day. I can send refill when you need it.  Pt should be called with tens unit soon.  Use heat and icy hot patches.  Specifically went over piriformis exercises/stretches.   Pt is not able to work. Will write out for the next 2 weeks.   .Marland KitchenSpent 30 minutes with patient and greater than 50 percent of visit spent counseling patient regarding treatment plan.

## 2018-10-09 NOTE — Telephone Encounter (Signed)
Wife would like to know if patient needs to come in to be seen after shot. Patient states that he is not doing good at all and can barely walk. His appointment at the hospital is at 1:30pm and would like to know what PCP thought if patient came in after. Please advise.

## 2018-10-09 NOTE — Telephone Encounter (Signed)
Appointment has been made for requested time. No further questions at this time.

## 2018-10-09 NOTE — Patient Instructions (Signed)
Piriformis Syndrome Rehab Ask your health care provider which exercises are safe for you. Do exercises exactly as told by your health care provider and adjust them as directed. It is normal to feel mild stretching, pulling, tightness, or discomfort as you do these exercises, but you should stop right away if you feel sudden pain or your pain gets worse.Do not begin these exercises until told by your health care provider. Stretching and range of motion exercises These exercises warm up your muscles and joints and improve the movement and flexibility of your hip and pelvis. These exercises also help to relieve pain, numbness, and tingling. Exercise A: Hip rotators  1. Lie on your back on a firm surface. 2. Pull your left / right knee toward your same shoulder with your left / right hand until your knee is pointing toward the ceiling. Hold your left / right ankle with your other hand. 3. Keeping your knee steady, gently pull your left / right ankle toward your other shoulder until you feel a stretch in your buttocks. 4. Hold this position for __________ seconds. Repeat __________ times. Complete this stretch __________ times a day. Exercise B: Hip extensors 1. Lie on your back on a firm surface. Both of your legs should be straight. 2. Pull your left / right knee to your chest. Hold your leg in this position by holding onto the back of your thigh or the front of your knee. 3. Hold this position for __________ seconds. 4. Slowly return to the starting position. Repeat __________ times. Complete this stretch __________ times a day. Strengthening exercises These exercises build strength and endurance in your hip and thigh muscles. Endurance is the ability to use your muscles for a long time, even after they get tired. Exercise C: Straight leg raises (hip abductors)  1. Lie on your side with your left / right leg in the top position. Lie so your head, shoulder, knee, and hip line up. Bend your bottom  knee to help you balance. 2. Lift your top leg up 4-6 inches (10-15 cm), keeping your toes pointed straight ahead. 3. Hold this position for __________ seconds. 4. Slowly lower your leg to the starting position. Let your muscles relax completely. Repeat __________ times. Complete this exercise__________ times a day. Exercise D: Hip abductors and rotators, quadruped  1. Get on your hands and knees on a firm, lightly padded surface. Your hands should be directly below your shoulders, and your knees should be directly below your hips. 2. Lift your left / right knee out to the side. Keep your knee bent. Do not twist your body. 3. Hold this position for __________ seconds. 4. Slowly lower your leg. Repeat __________ times. Complete this exercise__________ times a day. Exercise E: Straight leg raises (hip extensors) 1. Lie on your abdomen on a bed or a firm surface with a pillow under your hips. 2. Squeeze your buttock muscles and lift your left / right thigh off the bed. Do not let your back arch. 3. Hold this position for __________ seconds. 4. Slowly return to the starting position. Let your muscles relax completely before doing another repetition. Repeat __________ times. Complete this exercise__________ times a day. This information is not intended to replace advice given to you by your health care provider. Make sure you discuss any questions you have with your health care provider. Document Released: 04/04/2005 Document Revised: 12/08/2015 Document Reviewed: 03/17/2015 Elsevier Interactive Patient Education  2019 [employer].

## 2018-10-12 ENCOUNTER — Telehealth: Payer: Self-pay | Admitting: Physician Assistant

## 2018-10-12 NOTE — Telephone Encounter (Signed)
Left message on machine for patient to call back to let us know how he is doing. Dana.

## 2018-10-12 NOTE — Telephone Encounter (Signed)
Will you call Kenneth Duncan and see if he is any better?

## 2018-10-17 ENCOUNTER — Telehealth: Payer: Self-pay | Admitting: Neurology

## 2018-10-17 ENCOUNTER — Ambulatory Visit (INDEPENDENT_AMBULATORY_CARE_PROVIDER_SITE_OTHER): Payer: BC Managed Care – PPO | Admitting: Physician Assistant

## 2018-10-17 ENCOUNTER — Encounter: Payer: Self-pay | Admitting: Physician Assistant

## 2018-10-17 ENCOUNTER — Telehealth: Payer: Self-pay | Admitting: Physician Assistant

## 2018-10-17 VITALS — Ht 74.0 in

## 2018-10-17 DIAGNOSIS — K900 Celiac disease: Secondary | ICD-10-CM | POA: Diagnosis not present

## 2018-10-17 DIAGNOSIS — Z125 Encounter for screening for malignant neoplasm of prostate: Secondary | ICD-10-CM

## 2018-10-17 DIAGNOSIS — K2100 Gastro-esophageal reflux disease with esophagitis, without bleeding: Secondary | ICD-10-CM

## 2018-10-17 DIAGNOSIS — Z13220 Encounter for screening for lipoid disorders: Secondary | ICD-10-CM

## 2018-10-17 DIAGNOSIS — G8929 Other chronic pain: Secondary | ICD-10-CM

## 2018-10-17 DIAGNOSIS — F331 Major depressive disorder, recurrent, moderate: Secondary | ICD-10-CM | POA: Diagnosis not present

## 2018-10-17 DIAGNOSIS — G5703 Lesion of sciatic nerve, bilateral lower limbs: Secondary | ICD-10-CM | POA: Diagnosis not present

## 2018-10-17 DIAGNOSIS — M5441 Lumbago with sciatica, right side: Secondary | ICD-10-CM

## 2018-10-17 DIAGNOSIS — M51369 Other intervertebral disc degeneration, lumbar region without mention of lumbar back pain or lower extremity pain: Secondary | ICD-10-CM

## 2018-10-17 DIAGNOSIS — E538 Deficiency of other specified B group vitamins: Secondary | ICD-10-CM

## 2018-10-17 DIAGNOSIS — I10 Essential (primary) hypertension: Secondary | ICD-10-CM

## 2018-10-17 DIAGNOSIS — M5136 Other intervertebral disc degeneration, lumbar region: Secondary | ICD-10-CM

## 2018-10-17 DIAGNOSIS — J302 Other seasonal allergic rhinitis: Secondary | ICD-10-CM

## 2018-10-17 DIAGNOSIS — M533 Sacrococcygeal disorders, not elsewhere classified: Secondary | ICD-10-CM

## 2018-10-17 DIAGNOSIS — Z131 Encounter for screening for diabetes mellitus: Secondary | ICD-10-CM

## 2018-10-17 DIAGNOSIS — M48061 Spinal stenosis, lumbar region without neurogenic claudication: Secondary | ICD-10-CM

## 2018-10-17 DIAGNOSIS — E559 Vitamin D deficiency, unspecified: Secondary | ICD-10-CM

## 2018-10-17 DIAGNOSIS — M5126 Other intervertebral disc displacement, lumbar region: Secondary | ICD-10-CM

## 2018-10-17 MED ORDER — CYANOCOBALAMIN 1000 MCG/ML IJ SOLN: 1000.0000 ug | INTRAMUSCULAR | 3 refills | Status: DC

## 2018-10-17 MED ORDER — VITAMIN D (ERGOCALCIFEROL) 1.25 MG (50000 UNIT) PO CAPS: 50000.0000 [IU] | ORAL_CAPSULE | ORAL | 1 refills | Status: DC

## 2018-10-17 NOTE — Telephone Encounter (Signed)
Patient's wife left vm stating she did cancel his colonoscopy tomorrow, states if late cancellation they could be charged $250. She let them know that cancellation was per [facility]. If any issues she will let us know.

## 2018-10-17 NOTE — Progress Notes (Signed)
Patient having some weakness, but pain much better. Only taking tylenol. Concerned about being able to return to work due to weakness.   Also requesting refills vit D and B12 injections.

## 2018-10-17 NOTE — Telephone Encounter (Signed)
Has not gotten tens unit yet, can we call shannon and see whats going on?

## 2018-10-17 NOTE — Telephone Encounter (Signed)
I called Kenneth Duncan and left a voicemail for her to call me about this. - CF

## 2018-10-17 NOTE — Progress Notes (Signed)
Patient ID: Kenneth Duncan, male   DOB: Sep 10, 1955, 63 y.o.   MRN: [id number] .Virtual Visit via Video Note  I connected with Kenneth Duncan on 10/17/18 at  1:00 PM EDT by a video enabled telemedicine application and verified that I am speaking with the correct person using two identifiers.  Location: Patient: home Provider: clinic   I discussed the limitations of evaluation and management by telemedicine and the availability of in person appointments. The patient expressed understanding and agreed to proceed.  History of Present Illness: Pt is a 63 yo male with hx of lumbar DD, lumbar herniated disc, chronic SI joint pain, and piriformis syndrome, bilaterally. This all stems from workmans comp injury many years ago. He has been in pretty severe pain since 09/27/2018. He has not been able to sit, stand, walk, push , pull, lift or function. MRI unchanged with no acute findings. On 6/23 he was given two shots in piriformis and came to see me. He is doing much better. He is only taking tylenol for pain at this point. He can sit, stand and walk for a little longer now. He continues to feel really weak. He has just started exercises and going to PT next week. He still has limited function. He has still not received the tens unit.   He denies any fever, cough, shortness of breath.    .. Active Ambulatory Problems    Diagnosis Date Noted  . Chronic low back pain 06/14/2011  . Degenerative disc disease, lumbar 06/14/2011  . Hypertension 06/14/2011  . GERD (gastroesophageal reflux disease) 06/14/2011  . Anxiety disorder due to general medical condition 06/14/2011  . Celiac disease 06/14/2011  . B12 deficiency 07/28/2012  . Herpetiformis dermatitis 07/28/2012  . Hemorrhoid prolapse 08/15/2013  . OSA (obstructive sleep apnea) 08/24/2015  . Benign prostatic hyperplasia 10/21/2015  . No energy 10/21/2015  . Elevated LDL cholesterol level 10/28/2015  . Vitamin D deficiency 10/28/2015  . History of  colon polyps 11/01/2016  . Angio-edema 03/07/2017  . Chronic SI joint pain 05/21/2017  . Lumbar herniated disc 05/23/2017  . Lumbar foraminal stenosis 05/23/2017  . Piriformis syndrome of both sides 10/09/2018   Resolved Ambulatory Problems    Diagnosis Date Noted  . No Resolved Ambulatory Problems   No Additional Past Medical History   Reviewed med, allergy, problem list.      Observations/Objective: No acute distress. He now has color back in his face.  He is dressed appropriately.   . Today's Vitals   10/17/18 1150  Height: 6' 2"  (1.88 m)   Body mass index is 36.59 kg/m.   Assessment and Plan: Marland KitchenMarland KitchenJoseandres was seen today for pain.  Diagnoses and all orders for this visit:  Piriformis syndrome of both sides  Moderate episode of recurrent major depressive disorder (HCC) -     cyanocobalamin (,VITAMIN B-12,) 1000 MCG/ML injection; Inject 1 mL (1,000 mcg total) into the muscle every 30 (thirty) days.  Gastroesophageal reflux disease with esophagitis -     cyanocobalamin (,VITAMIN B-12,) 1000 MCG/ML injection; Inject 1 mL (1,000 mcg total) into the muscle every 30 (thirty) days.  Celiac disease -     cyanocobalamin (,VITAMIN B-12,) 1000 MCG/ML injection; Inject 1 mL (1,000 mcg total) into the muscle every 30 (thirty) days.  Screening for lipid disorders -     cyanocobalamin (,VITAMIN B-12,) 1000 MCG/ML injection; Inject 1 mL (1,000 mcg total) into the muscle every 30 (thirty) days.  Screening for diabetes mellitus -  cyanocobalamin (,VITAMIN B-12,) 1000 MCG/ML injection; Inject 1 mL (1,000 mcg total) into the muscle every 30 (thirty) days.  B12 deficiency -     cyanocobalamin (,VITAMIN B-12,) 1000 MCG/ML injection; Inject 1 mL (1,000 mcg total) into the muscle every 30 (thirty) days.  Vitamin D insufficiency -     cyanocobalamin (,VITAMIN B-12,) 1000 MCG/ML injection; Inject 1 mL (1,000 mcg total) into the muscle every 30 (thirty) days. -     Vitamin D,  Ergocalciferol, (DRISDOL) 1.25 MG (50000 UT) CAPS capsule; Take 1 capsule (50,000 Units total) by mouth every 7 (seven) days.  Prostate cancer screening -     cyanocobalamin (,VITAMIN B-12,) 1000 MCG/ML injection; Inject 1 mL (1,000 mcg total) into the muscle every 30 (thirty) days.  Essential hypertension -     cyanocobalamin (,VITAMIN B-12,) 1000 MCG/ML injection; Inject 1 mL (1,000 mcg total) into the muscle every 30 (thirty) days.  Seasonal allergies -     cyanocobalamin (,VITAMIN B-12,) 1000 MCG/ML injection; Inject 1 mL (1,000 mcg total) into the muscle every 30 (thirty) days.  Degenerative disc disease, lumbar  Lumbar foraminal stenosis  Lumbar herniated disc  Chronic bilateral low back pain with right-sided sciatica  Chronic SI joint pain   Follow up after low back pain exacerbation of pain. He was originally written to go back to work 10/24/2018. Due to progress and weakness I am pushing back to 10/31/2018. He will follow up virtually 10/29/2018. Continue to work with PT. Muscle relaxer as needed. Tylenol as needed. Alternate heat and ice. Will find out why has not gotten tens unit yet.   Follow Up Instructions:    I discussed the assessment and treatment plan with the patient. The patient was provided an opportunity to ask questions and all were answered. The patient agreed with the plan and demonstrated an understanding of the instructions.   The patient was advised to call back or seek an in-person evaluation if the symptoms worsen or if the condition fails to improve as anticipated.    Iran Planas, PA-C

## 2018-10-17 NOTE — Telephone Encounter (Signed)
Thank you.   Can you also ask if the electrode pad cover comes off and broken is their a way to get fixed or get the flex it replaced?

## 2018-10-18 NOTE — Telephone Encounter (Signed)
[employer] forms faxed to [phone] with confirmation received. Copy to chart.

## 2018-10-25 ENCOUNTER — Telehealth: Payer: Self-pay

## 2018-10-25 NOTE — Telephone Encounter (Signed)
[employer] forms need to be filled out for a [employer], commissioning.   Fax form to  [phone]  Probably will need his waist measurement.

## 2018-10-28 ENCOUNTER — Other Ambulatory Visit: Payer: Self-pay | Admitting: Physician Assistant

## 2018-10-28 DIAGNOSIS — F331 Major depressive disorder, recurrent, moderate: Secondary | ICD-10-CM

## 2018-10-29 ENCOUNTER — Other Ambulatory Visit: Payer: Self-pay

## 2018-10-29 ENCOUNTER — Ambulatory Visit (INDEPENDENT_AMBULATORY_CARE_PROVIDER_SITE_OTHER): Payer: Worker's Compensation | Admitting: Physician Assistant

## 2018-10-29 ENCOUNTER — Encounter: Payer: Self-pay | Admitting: Physician Assistant

## 2018-10-29 VITALS — BP 135/86 | HR 70 | Temp 98.5°F | Ht 74.0 in | Wt 286.0 lb

## 2018-10-29 DIAGNOSIS — M5126 Other intervertebral disc displacement, lumbar region: Secondary | ICD-10-CM

## 2018-10-29 DIAGNOSIS — F331 Major depressive disorder, recurrent, moderate: Secondary | ICD-10-CM

## 2018-10-29 DIAGNOSIS — M5441 Lumbago with sciatica, right side: Secondary | ICD-10-CM | POA: Diagnosis not present

## 2018-10-29 DIAGNOSIS — G8929 Other chronic pain: Secondary | ICD-10-CM

## 2018-10-29 DIAGNOSIS — M5136 Other intervertebral disc degeneration, lumbar region: Secondary | ICD-10-CM | POA: Diagnosis not present

## 2018-10-29 DIAGNOSIS — M533 Sacrococcygeal disorders, not elsewhere classified: Secondary | ICD-10-CM

## 2018-10-29 DIAGNOSIS — G5703 Lesion of sciatic nerve, bilateral lower limbs: Secondary | ICD-10-CM | POA: Diagnosis not present

## 2018-10-29 DIAGNOSIS — M48061 Spinal stenosis, lumbar region without neurogenic claudication: Secondary | ICD-10-CM

## 2018-10-29 MED ORDER — ESCITALOPRAM OXALATE 20 MG PO TABS: 20.0000 mg | ORAL_TABLET | Freq: Every day | ORAL | 3 refills | Status: DC

## 2018-10-29 NOTE — Progress Notes (Signed)
   Subjective:    Patient ID: Kenneth Duncan, male    DOB: [birth year], 63 y.o.   MRN: [id number]  HPI  Pt is a 63 yo male with chronic back pain due to piriformis syndrome, lumbar DDD, lumbar herniation/stenosis, SI joint dysfunction who presents to the clinic to evaluate going back to work. Pt is doing much better. He has last seen on 10/17/2018. He has more energy and sleeping better. He can still only walk about 10 minutes before needing to rest. He has been able to go up his home steps. Pain has decreased significantly. He is not taking any narcotics.  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 He has not been to PT yet. He has been doing some home exercises daily. He has not been to chiropractor yet.   .. Active Ambulatory Problems    Diagnosis Date Noted  . Chronic low back pain 06/14/2011  . Degenerative disc disease, lumbar 06/14/2011  . Hypertension 06/14/2011  . GERD (gastroesophageal reflux disease) 06/14/2011  . Anxiety disorder due to general medical condition 06/14/2011  . Celiac disease 06/14/2011  . B12 deficiency 07/28/2012  . Herpetiformis dermatitis 07/28/2012  . Hemorrhoid prolapse 08/15/2013  . OSA (obstructive sleep apnea) 08/24/2015  . Benign prostatic hyperplasia 10/21/2015  . No energy 10/21/2015  . Elevated LDL cholesterol level 10/28/2015  . Vitamin D deficiency 10/28/2015  . History of colon polyps 11/01/2016  . Angio-edema 03/07/2017  . Chronic SI joint pain 05/21/2017  . Lumbar herniated disc 05/23/2017  . Lumbar foraminal stenosis 05/23/2017  . Piriformis syndrome of both sides 10/09/2018   Resolved Ambulatory Problems    Diagnosis Date Noted  . No Resolved Ambulatory Problems   No Additional Past Medical History       Review of Systems See HPI.     Objective:   Physical Exam Vitals signs reviewed.  HENT:     Head: Normocephalic.  Cardiovascular:     Rate and Rhythm: Normal rate.  Pulmonary:     Effort: Pulmonary  effort is normal.  Musculoskeletal:     Comments: Bilateral lower extremity strength 5/5.  Forward flexion at waist with no pain.  Tenderness over paraspinal muscles of lumbar spine.  No pain in piriformis area today.    Neurological:     General: No focal deficit present.     Mental Status: He is alert.     Gait: Gait normal.           Assessment & Plan:  Marland KitchenMarland KitchenDenarius was seen today for back pain.  Diagnoses and all orders for this visit:  Chronic bilateral low back pain with right-sided sciatica  Moderate episode of recurrent major depressive disorder (HCC) -     escitalopram (LEXAPRO) 20 MG tablet; Take 1 tablet (20 mg total) by mouth daily.  Piriformis syndrome of both sides  Degenerative disc disease, lumbar  Lumbar herniated disc  Lumbar foraminal stenosis  Chronic SI joint pain   Ok to go back to work 10/31/2018 full duty with restriction of lifting more than 30lbs. He is able to modify how much he sits or stands. Continue to follow up with Dr. Lyla Son. Make PT appt. Keep doing conservative treatment to get fully better.

## 2018-10-30 ENCOUNTER — Encounter: Payer: Self-pay | Admitting: Physician Assistant

## 2018-11-05 ENCOUNTER — Other Ambulatory Visit: Payer: Self-pay | Admitting: Neurology

## 2018-11-05 DIAGNOSIS — J302 Other seasonal allergic rhinitis: Secondary | ICD-10-CM

## 2018-11-05 MED ORDER — FLUTICASONE PROPIONATE 50 MCG/ACT NA SUSP: NASAL | 1 refills | Status: DC

## 2018-12-26 NOTE — Telephone Encounter (Signed)
Patient will call workers comp to find out what is going on.

## 2018-12-27 ENCOUNTER — Ambulatory Visit (INDEPENDENT_AMBULATORY_CARE_PROVIDER_SITE_OTHER): Payer: BC Managed Care – PPO

## 2018-12-27 ENCOUNTER — Ambulatory Visit (INDEPENDENT_AMBULATORY_CARE_PROVIDER_SITE_OTHER): Payer: BC Managed Care – PPO | Admitting: Family Medicine

## 2018-12-27 ENCOUNTER — Other Ambulatory Visit: Payer: Self-pay

## 2018-12-27 VITALS — BP 152/89 | HR 65 | Wt 301.0 lb

## 2018-12-27 DIAGNOSIS — M50221 Other cervical disc displacement at C4-C5 level: Secondary | ICD-10-CM | POA: Diagnosis not present

## 2018-12-27 DIAGNOSIS — M5412 Radiculopathy, cervical region: Secondary | ICD-10-CM | POA: Diagnosis not present

## 2018-12-27 MED ORDER — PREDNISONE 50 MG PO TABS: 50.0000 mg | ORAL_TABLET | Freq: Every day | ORAL | 0 refills | Status: DC

## 2018-12-27 NOTE — Progress Notes (Signed)
Kenneth Duncan is a 63 y.o. male who presents to [facility] today for right trapezius and arm pain.  Patient notes about 1 to 2 weeks ago he developed pain in his right trapezius and radiating down to his arm and forearm and hand.  He has pain and tingling down his forearm to the second and third digits of his right hand.  He denies any tingling into the thumb or fourth or fifth digits.  His pain is not bothered by shoulder motion.  He is not sure if neck motion makes it worse.  This pain started after he was pulling on an object but denies severe injury.   He has a history of right shoulder subacromial debridement and rotator cuff tendinitis in the past.  He notes his pain now is not consistent with previous shoulder rotator cuff pain.  He has had a history of cervical fusion as well he thinks at C5-6 but is not sure.  He is tried relative rest and some shoulder motion exercises which have not helped much.  Uses a sling at time which tends to take some the pressure off of his trapezius.  He has chronic back pain which is managed by Dr. Francesco Runner (pain management) in [place] receiving regular low back injections.  He notes that in the past he is tried gabapentin and did not tolerate it.  ROS:  As above  Exam:  BP (!) 152/89   Pulse 65   Wt (!) 301 lb (136.5 kg)   SpO2 94%   BMI 38.65 kg/m  Wt Readings from Last 5 Encounters:  12/27/18 (!) 301 lb (136.5 kg)  10/29/18 286 lb (129.7 kg)  10/09/18 285 lb (129.3 kg)  10/01/18 296 lb (134.3 kg)  05/18/18 300 lb (136.1 kg)   General: Well Developed, well nourished, and in no acute distress.  Neuro/Psych: Alert and oriented x3, extra-ocular muscles intact, able to move all 4 extremities, sensation grossly intact. Skin: Warm and dry, no rashes noted.  Respiratory: Not using accessory muscles, speaking in full sentences, trachea midline.  Cardiovascular: Pulses palpable, no extremity edema.  Abdomen: Does not appear distended. MSK:  C-spine: Nontender to spinal midline.  Tender palpation right trapezius. Somewhat decreased cervical spine motion especially extension and flexion.  Cervical motion does not particularly worsen his arm and shoulder pain. Negative Spurling's test. Upper extremity strength reflexes and sensation are equal normal throughout bilateral upper extremities.  Right shoulder: Normal-appearing nontender normal motion normal strength negative impingement testing.  Left shoulder: Normal-appearing nontender normal motion normal strength negative interval testing  Right elbow normal-appearing normal motion normal strength.  Nontender.  Negative Tinel's in the cubital tunnel.  Left elbow normal-appearing normal motion normal strength nontender negative Tinel's at cubital tunnel  Right wrist and hand normal-appearing normal motion nontender negative Tinel's at carpal tunnel.  Negative Phalen's test.  Normal strength.  Normal-appearing normal motion nontender negative Tinel's at carpal tunnel.  Negative Phalen's test.    Lab and Radiology Results X-ray images C-spine obtained today personally independently reviewed Intact anterior fusion cervical hardware at C5-6.  Good bony fusion present.  Significant DDD at C6-7 with spur formation.  Neuroforaminal narrowing present.  No acute fractures. Await formal radiology review    Assessment and Plan: 63 y.o. 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 Symptoms present for about 1 to 2 weeks and occurred after he pulled a heavy object.  X-ray per my read today  concerning for bony narrowing or neuroforaminal stenosis at the C6-7 level which would impact C7 nerve root. The trapezius pain could certainly be part of the radicular component or could be trapezius spasm.  Plan for short course of prednisone and physical therapy.  Recommend heating pad as well.  Use sling as needed but  encouraged frequent shoulder motion to avoid stiffness.  Patient has had treat gabapentin trials in the past and not tolerated it very well therefore would not use gabapentin.  Lyrica could be an option going forward if needed.  Check back in 4 to 6 weeks if not improved or sooner if needed.  If no improvement or if worsening next step would be MRI for injection planning.  He already has established relationship with Dr. Francesco Runner therefore that would be a logical place to proceed with injections if needed.  CC: PCP  Donella Stade, PA-C   CC Eddie Dibbles, MD  [facility]  [address], [place] [address]  Phone: [phone]  Fax: [phone]   PDMP not reviewed this encounter. Orders Placed This Encounter  Procedures  . DG Cervical Spine Complete    Standing Status:   Future    Number of Occurrences:   1    Standing Expiration Date:   02/26/2020    Order Specific Question:   Reason for Exam (SYMPTOM  OR DIAGNOSIS REQUIRED)    Answer:   eval right trap pain and cervial radicuopathy likely c7    Order Specific Question:   Preferred imaging location?    Answer:   [facility]    Order Specific Question:   Radiology Contrast Protocol - do NOT remove file path    Answer:   [email or web address]  . Ambulatory referral to Physical Therapy    Referral Priority:   Routine    Referral Type:   Physical Medicine    Referral Reason:   Specialty Services Required    Requested Specialty:   Physical Therapy    Number of Visits Requested:   1   Meds ordered this encounter  Medications  . predniSONE (DELTASONE) 50 MG tablet    Sig: Take 1 tablet (50 mg total) by mouth daily.    Dispense:  5 tablet    Refill:  0    Historical information moved to improve visibility of documentation.  Past Medical History:  Diagnosis Date  . Celiac disease   . Degenerative disc disease, lumbar   . GERD (gastroesophageal  reflux disease)   . Hypertension    Past Surgical History:  Procedure Laterality Date  . ANTERIOR CERVICAL DECOMP/DISCECTOMY FUSION    . APPENDECTOMY    . clavicle     distal end removed.  . COLONOSCOPY W/ BIOPSIES  10/17/2016  . knee arthoscopic surgery      Bilateral menicus tear   Social History   Tobacco Use  . Smoking status: Never Smoker  . Smokeless tobacco: Never Used  Substance Use Topics  . Alcohol use: No   family history includes Diabetes in his father and sister; Heart attack in his father and sister; Heart attack (age of onset: 61) in his mother; Heart disease in his father and sister; Lung cancer in his brother; Lymphoma in his brother; Uterine cancer in his mother and sister.  Medications: Current Outpatient Medications  Medication Sig Dispense Refill  . AMBULATORY NON FORMULARY MEDICATION CPAP pressure to be auto titration 5-20cm H20 and mask adjusted as needed. Diagnosis: OSA 1 Units 0  .  AMBULATORY NON FORMULARY MEDICATION Syringes and 1" 25g needles for B12 injections 12 each 1  . AMBULATORY NON FORMULARY MEDICATION Flex it Tens Unit to use as needed for muscle/back pain. 360 g 5  . atorvastatin (LIPITOR) 20 MG tablet Take 1 tablet (20 mg total) by mouth daily. 90 tablet 3  . cyanocobalamin (,VITAMIN B-12,) 1000 MCG/ML injection Inject 1 mL (1,000 mcg total) into the muscle every 30 (thirty) days. 3 mL 3  . dapsone 100 MG tablet Take 1 tablet (100 mg total) by mouth 2 (two) times daily. 180 tablet 3  . escitalopram (LEXAPRO) 20 MG tablet Take 1 tablet (20 mg total) by mouth daily. 90 tablet 3  . fluticasone (FLONASE) 50 MCG/ACT nasal spray INSTILL ONE SPRAY IN EACH NOSTRIL ONE TIME DAILY 48 g 1  . olmesartan (BENICAR) 40 MG tablet Take 1 tablet (40 mg total) by mouth daily. 90 tablet 3  . omeprazole (PRILOSEC) 20 MG capsule Take 1 capsule (20 mg total) by mouth daily. 90 capsule 3  . triamcinolone cream (KENALOG) 0.1 % Apply 1 application topically 2 (two)  times daily. 80 g 1  . Vitamin D, Ergocalciferol, (DRISDOL) 1.25 MG (50000 UT) CAPS capsule Take 1 capsule (50,000 Units total) by mouth every 7 (seven) days. 12 capsule 1  . predniSONE (DELTASONE) 50 MG tablet Take 1 tablet (50 mg total) by mouth daily. 5 tablet 0   No current facility-administered medications for this visit.    Allergies  Allergen Reactions  . Lisinopril Swelling    Angioedema limited to lips  . Penicillins     Pt does not remember allergy- told as child      Discussed warning signs or symptoms. Please see discharge instructions. Patient expresses understanding.

## 2018-12-27 NOTE — Patient Instructions (Signed)
Thank you for coming in today. Take prednisone for 5 days.  Attend PT (ok to add on to [facility]).  Get xray now.  If not improving in 4 weeks or if worsening let me know.  Next step is MRI for injection planning.  Dr Francesco Runner can do the injections.    Cervical Radiculopathy  Cervical radiculopathy happens when a nerve in the neck (a cervical nerve) is pinched or bruised. This condition can happen because of an injury to the cervical spine (vertebrae) in the neck, or as part of the normal aging process. Pressure on the cervical nerves can cause pain or numbness that travels from the neck all the way down into the arm and fingers. Usually, this condition gets better with rest. Treatment may be needed if the condition does not improve. What are the causes? This condition may be caused by:  A neck injury.  A bulging (herniated) disk.  Muscle spasms.  Muscle tightness in the neck because of overuse.  Arthritis.  Breakdown or degeneration in the bones and joints of the spine (spondylosis) due to aging.  Bone spurs that may develop near the cervical nerves. What are the signs or symptoms? Symptoms of this condition include:  Pain. The pain may travel from the neck to the arm and hand. The pain can be severe or irritating. It may be worse when you move your neck.  Numbness or tingling in your arm or hand.  Weakness in the affected arm and hand, in severe cases. How is this diagnosed? This condition may be diagnosed based on your symptoms, your medical history, and a physical exam. You may also have tests, including:  X-rays.  A CT scan.  An MRI.  An electromyogram (EMG).  Nerve conduction tests. How is this treated? In many cases, treatment is not needed for this condition. With rest, the condition usually gets better over time. If treatment is needed, options may include:  Wearing a soft neck collar (cervical collar) for short periods of time, as told by your health care  provider.  Doing physical therapy to strengthen your neck muscles.  Taking medicines, such as NSAIDs or oral corticosteroids.  Having spinal injections, in severe cases.  Having surgery. This may be needed if other treatments do not help. Different types of surgery may be done depending on the cause of this condition. Follow these instructions at home: If you have a cervical collar:  Wear it as told by your health care provider. Remove it only as told by your health care provider.  Ask your health care provider if you can remove the collar for cleaning and bathing. If you are allowed to remove the collar for cleaning or bathing: ? Follow instructions from your health care provider about how to remove the collar safely. ? Clean the collar by wiping it with mild soap and water and drying it completely. ? Take out any removable pads in the collar every 1-2 days, and wash them by hand with soap and water. Let them air-dry completely before you put them back in the collar. ? Check your skin under the collar for irritation or sores. If you see any, tell your health care provider. Managing pain      Take over-the-counter and prescription medicines only as told by your health care provider.  If directed, put ice on the affected area. ? If you have a soft neck collar, remove it as told by your health care provider. ? Put ice in a  plastic bag. ? Place a towel between your skin and the bag. ? Leave the ice on for 20 minutes, 2-3 times a day.  If applying ice does not help, you can try using heat. Use the heat source that your health care provider recommends, such as a moist heat pack or a heating pad. ? Place a towel between your skin and the heat source. ? Leave the heat on for 20-30 minutes. ? Remove the heat if your skin turns bright red. This is especially important if you are unable to feel pain, heat, or cold. You may have a greater risk of getting burned.  Try a gentle neck and  shoulder massage to help relieve symptoms. Activity  Rest as needed.  Return to your normal activities as told by your health care provider. Ask your health care provider what activities are safe for you.  Do stretching and strengthening exercises as told by your health care provider or physical therapist.  Do not lift anything that is heavier than 10 lb (4.5 kg) until your health care provider tells you that it is safe. General instructions  Use a flat pillow when you sleep.  Do not drive while wearing a cervical collar. If you do not have a cervical collar, ask your health care provider if it is safe to drive while your neck heals.  Ask your health care provider if the medicine prescribed to you requires you to avoid driving or using heavy machinery.  Do not use any products that contain nicotine or tobacco, such as cigarettes, e-cigarettes, and chewing tobacco. These can delay healing. If you need help quitting, ask your health care provider.  Keep all follow-up visits as told by your health care provider. This is important. Contact a health care provider if:  Your condition does not improve with treatment. Get help right away if:  Your pain gets much worse and cannot be controlled with medicines.  You have weakness or numbness in your hand, arm, face, or leg.  You have a high fever.  You have a stiff, rigid neck.  You lose control of your bowels or your bladder (have incontinence).  You have trouble with walking, balance, or speaking. Summary  Cervical radiculopathy happens when a nerve in the neck is pinched or bruised.  A nerve can get pinched from a bulging disk, arthritis, muscle spasms, or an injury to the neck.  Symptoms include pain, tingling, or numbness radiating from the neck into the arm or hand. Weakness can also occur in severe cases.  Treatment may include rest, wearing a cervical collar, and physical therapy. Medicines may be prescribed to help with  pain. In severe cases, injections or surgery may be needed. This information is not intended to replace advice given to you by your health care provider. Make sure you discuss any questions you have with your health care provider. Document Released: 12/28/2000 Document Revised: 02/23/2018 Document Reviewed: 02/23/2018 Elsevier Patient Education  2020 [employer].

## 2018-12-27 NOTE — Telephone Encounter (Signed)
Kenneth Duncan called, states she is working on this and will Musician with an update

## 2019-02-20 ENCOUNTER — Other Ambulatory Visit: Payer: Self-pay | Admitting: Physician Assistant

## 2019-04-20 ENCOUNTER — Other Ambulatory Visit: Payer: Self-pay | Admitting: Physician Assistant

## 2019-04-20 DIAGNOSIS — I10 Essential (primary) hypertension: Secondary | ICD-10-CM

## 2019-04-25 ENCOUNTER — Other Ambulatory Visit: Payer: Self-pay | Admitting: Physician Assistant

## 2019-04-25 DIAGNOSIS — E559 Vitamin D deficiency, unspecified: Secondary | ICD-10-CM

## 2019-05-03 ENCOUNTER — Other Ambulatory Visit: Payer: Self-pay | Admitting: Physician Assistant

## 2019-05-03 DIAGNOSIS — K2100 Gastro-esophageal reflux disease with esophagitis, without bleeding: Secondary | ICD-10-CM

## 2019-06-27 ENCOUNTER — Ambulatory Visit: Payer: BC Managed Care – PPO | Attending: Internal Medicine

## 2019-06-27 DIAGNOSIS — Z23 Encounter for immunization: Secondary | ICD-10-CM

## 2019-06-27 NOTE — Progress Notes (Signed)
   Covid-19 Vaccination Clinic  Name:  Kenneth Duncan    MRN: [id number] DOB: 1955-06-04  06/27/2019  Mr. Greenhaw was observed post Covid-19 immunization for 15 minutes without incident. He was provided with Vaccine Information Sheet and instruction to access the V-Safe system.   Mr. Pietrzak was instructed to call 911 with any severe reactions post vaccine: Marland Kitchen Difficulty breathing  . Swelling of face and throat  . A fast heartbeat  . A bad rash all over body  . Dizziness and weakness   Immunizations Administered    Name Date Dose VIS Date Route   Pfizer COVID-19 Vaccine 06/27/2019  8:38 AM 0.3 mL 03/29/2019 Intramuscular   Manufacturer: [place]   Lot: [id number]   [place]: [id number]

## 2019-07-05 ENCOUNTER — Other Ambulatory Visit: Payer: Self-pay | Admitting: Physician Assistant

## 2019-07-05 DIAGNOSIS — I10 Essential (primary) hypertension: Secondary | ICD-10-CM

## 2019-07-13 ENCOUNTER — Other Ambulatory Visit: Payer: Self-pay | Admitting: Physician Assistant

## 2019-07-22 ENCOUNTER — Ambulatory Visit: Payer: BC Managed Care – PPO | Attending: Internal Medicine

## 2019-07-22 DIAGNOSIS — Z23 Encounter for immunization: Secondary | ICD-10-CM

## 2019-07-22 NOTE — Progress Notes (Signed)
   Covid-19 Vaccination Clinic  Name:  Kenneth Duncan    MRN: [id number] DOB: 07-May-1955  07/22/2019  Kenneth Duncan was observed post Covid-19 immunization for 15 minutes without incident. He was provided with Vaccine Information Sheet and instruction to access the V-Safe system.   Kenneth Duncan was instructed to call 911 with any severe reactions post vaccine: Marland Kitchen Difficulty breathing  . Swelling of face and throat  . A fast heartbeat  . A bad rash all over body  . Dizziness and weakness   Immunizations Administered    Name Date Dose VIS Date Route   Pfizer COVID-19 Vaccine 07/22/2019  8:35 AM 0.3 mL 03/29/2019 Intramuscular   Manufacturer: [place]   Lot: [id number]   [place]: [id number]

## 2019-07-29 ENCOUNTER — Other Ambulatory Visit: Payer: Self-pay | Admitting: Physician Assistant

## 2019-07-29 DIAGNOSIS — J302 Other seasonal allergic rhinitis: Secondary | ICD-10-CM

## 2019-08-07 ENCOUNTER — Other Ambulatory Visit: Payer: Self-pay | Admitting: Neurology

## 2019-08-07 DIAGNOSIS — J302 Other seasonal allergic rhinitis: Secondary | ICD-10-CM

## 2019-08-07 MED ORDER — FLUTICASONE PROPIONATE 50 MCG/ACT NA SUSP: NASAL | 1 refills | Status: DC

## 2019-08-07 NOTE — Telephone Encounter (Signed)
Patient has been taking flonase 2 sprays in each nostril daily and RX is written for 1 spray per nostril daily. Can we change the way this is written so they can send to him? Pended.

## 2019-08-23 ENCOUNTER — Other Ambulatory Visit: Payer: Self-pay | Admitting: Neurology

## 2019-08-23 DIAGNOSIS — K900 Celiac disease: Secondary | ICD-10-CM

## 2019-08-23 MED ORDER — DAPSONE 100 MG PO TABS: 100.0000 mg | ORAL_TABLET | Freq: Two times a day (BID) | ORAL | 1 refills | Status: DC

## 2019-10-05 ENCOUNTER — Other Ambulatory Visit: Payer: Self-pay | Admitting: Physician Assistant

## 2019-10-05 DIAGNOSIS — I10 Essential (primary) hypertension: Secondary | ICD-10-CM

## 2019-10-10 ENCOUNTER — Other Ambulatory Visit: Payer: Self-pay | Admitting: Physician Assistant

## 2019-10-10 DIAGNOSIS — E559 Vitamin D deficiency, unspecified: Secondary | ICD-10-CM

## 2019-10-11 ENCOUNTER — Other Ambulatory Visit: Payer: Self-pay | Admitting: Physician Assistant

## 2019-10-23 ENCOUNTER — Other Ambulatory Visit: Payer: Self-pay | Admitting: Physician Assistant

## 2019-10-29 ENCOUNTER — Telehealth: Payer: Self-pay

## 2019-10-29 DIAGNOSIS — E7800 Pure hypercholesterolemia, unspecified: Secondary | ICD-10-CM

## 2019-10-29 DIAGNOSIS — E559 Vitamin D deficiency, unspecified: Secondary | ICD-10-CM

## 2019-10-29 DIAGNOSIS — Z0000 Encounter for general adult medical examination without abnormal findings: Secondary | ICD-10-CM

## 2019-10-29 DIAGNOSIS — Z125 Encounter for screening for malignant neoplasm of prostate: Secondary | ICD-10-CM

## 2019-10-29 DIAGNOSIS — I10 Essential (primary) hypertension: Secondary | ICD-10-CM

## 2019-10-29 NOTE — Telephone Encounter (Signed)
Ordered labs

## 2019-11-01 ENCOUNTER — Ambulatory Visit (INDEPENDENT_AMBULATORY_CARE_PROVIDER_SITE_OTHER): Payer: BC Managed Care – PPO | Admitting: Sports Medicine

## 2019-11-01 ENCOUNTER — Encounter: Payer: Self-pay | Admitting: Sports Medicine

## 2019-11-01 ENCOUNTER — Other Ambulatory Visit: Payer: Self-pay

## 2019-11-01 ENCOUNTER — Ambulatory Visit (INDEPENDENT_AMBULATORY_CARE_PROVIDER_SITE_OTHER): Payer: BC Managed Care – PPO

## 2019-11-01 DIAGNOSIS — M25562 Pain in left knee: Secondary | ICD-10-CM | POA: Diagnosis not present

## 2019-11-01 DIAGNOSIS — S8992XA Unspecified injury of left lower leg, initial encounter: Secondary | ICD-10-CM | POA: Diagnosis not present

## 2019-11-01 DIAGNOSIS — M1712 Unilateral primary osteoarthritis, left knee: Secondary | ICD-10-CM

## 2019-11-01 DIAGNOSIS — S8991XA Unspecified injury of right lower leg, initial encounter: Secondary | ICD-10-CM | POA: Diagnosis not present

## 2019-11-01 MED ORDER — MELOXICAM 15 MG PO TABS: ORAL_TABLET | ORAL | 3 refills | Status: DC

## 2019-11-01 NOTE — Progress Notes (Signed)
    Procedures performed today:    None.  Independent interpretation of notes and tests performed by another provider:   None.  Brief History, Exam, Impression, and Recommendations:    Primary osteoarthritis of left knee This is a very pleasant 64 year old male, he has a long history of left knee pain, he has history of a partial meniscectomy in the distant past, now having pain at the medial joint line, swelling with obvious effusion, occasional mechanical symptoms which have started to improve on their own.  Pain is also improving considerably on its own. We will discontinue all other NSAIDs, switching to meloxicam with food, x-rays, meniscal tear rehab exercises, return to see me if needed in a month. At that point we can aspirate/inject.    ___________________________________________ Gwen Her. Dianah Field, M.D., ABFM., CAQSM. Primary Care and [place] Instructor of [place] [employer] [facility] of Medicine

## 2019-11-01 NOTE — Assessment & Plan Note (Signed)
This is a very pleasant 64 year old male, he has a long history of left knee pain, he has history of a partial meniscectomy in the distant past, now having pain at the medial joint line, swelling with obvious effusion, occasional mechanical symptoms which have started to improve on their own.  Pain is also improving considerably on its own. We will discontinue all other NSAIDs, switching to meloxicam with food, x-rays, meniscal tear rehab exercises, return to see me if needed in a month. At that point we can aspirate/inject.

## 2019-11-05 ENCOUNTER — Telehealth: Payer: Self-pay | Admitting: Neurology

## 2019-11-05 ENCOUNTER — Other Ambulatory Visit: Payer: Self-pay | Admitting: Physician Assistant

## 2019-11-05 NOTE — Telephone Encounter (Signed)
Patient's wife called to let us know patient will have labs in the morning, will address refills of atorvastatin once we receive results.

## 2019-11-06 DIAGNOSIS — E7800 Pure hypercholesterolemia, unspecified: Secondary | ICD-10-CM | POA: Diagnosis not present

## 2019-11-06 DIAGNOSIS — I10 Essential (primary) hypertension: Secondary | ICD-10-CM | POA: Diagnosis not present

## 2019-11-06 DIAGNOSIS — E559 Vitamin D deficiency, unspecified: Secondary | ICD-10-CM | POA: Diagnosis not present

## 2019-11-06 DIAGNOSIS — R7309 Other abnormal glucose: Secondary | ICD-10-CM | POA: Diagnosis not present

## 2019-11-06 DIAGNOSIS — Z125 Encounter for screening for malignant neoplasm of prostate: Secondary | ICD-10-CM | POA: Diagnosis not present

## 2019-11-06 DIAGNOSIS — Z0000 Encounter for general adult medical examination without abnormal findings: Secondary | ICD-10-CM | POA: Diagnosis not present

## 2019-11-07 ENCOUNTER — Other Ambulatory Visit: Payer: Self-pay | Admitting: Physician Assistant

## 2019-11-07 DIAGNOSIS — I10 Essential (primary) hypertension: Secondary | ICD-10-CM

## 2019-11-07 DIAGNOSIS — F331 Major depressive disorder, recurrent, moderate: Secondary | ICD-10-CM

## 2019-11-07 DIAGNOSIS — K900 Celiac disease: Secondary | ICD-10-CM

## 2019-11-07 MED ORDER — ESCITALOPRAM OXALATE 20 MG PO TABS: 20.0000 mg | ORAL_TABLET | Freq: Every day | ORAL | 3 refills | Status: DC

## 2019-11-07 MED ORDER — DAPSONE 100 MG PO TABS: 100.0000 mg | ORAL_TABLET | Freq: Two times a day (BID) | ORAL | 4 refills | Status: DC

## 2019-11-07 MED ORDER — OLMESARTAN MEDOXOMIL 40 MG PO TABS: 40.0000 mg | ORAL_TABLET | Freq: Every day | ORAL | 4 refills | Status: DC

## 2019-11-07 MED ORDER — ATORVASTATIN CALCIUM 20 MG PO TABS: 20.0000 mg | ORAL_TABLET | Freq: Every day | ORAL | 4 refills | Status: DC

## 2019-11-07 NOTE — Telephone Encounter (Signed)
Call pt:  HDL improved.  LDL much better.  TG improved but not quite to goal. Start a fish oil as well. 4082m daily to see if would help.   CBC perfect.   Kidney, liver look great.   Glucose fasting just a hair elevated. Please add a1c to labs.   PSA great and low.   Pending refills make [facility].   Awaiting vitamin D.

## 2019-11-08 LAB — LIPID PANEL W/REFLEX DIRECT LDL
Cholesterol: 171 mg/dL (ref <200)
HDL: 45 mg/dL (ref >=40)
LDL Cholesterol (Calc): 96 mg/dL (calc)
Non-HDL Cholesterol (Calc): 126 mg/dL (calc) (ref <130)
Total CHOL/HDL Ratio: 3.8 (calc) (ref <5.0)
Triglycerides: 200 mg/dL — ABNORMAL HIGH (ref <150)

## 2019-11-08 LAB — HEMOGLOBIN A1C W/OUT EAG: Hgb A1c MFr Bld: 4.1 % of total Hgb (ref <5.7)

## 2019-11-08 LAB — COMPLETE METABOLIC PANEL WITH GFR
AG Ratio: 2.1 (calc) (ref 1.0–2.5)
ALT: 28 U/L (ref 9–46)
AST: 21 U/L (ref 10–35)
Albumin: 4.6 g/dL (ref 3.6–5.1)
Alkaline phosphatase (APISO): 83 U/L (ref 35–144)
BUN: 16 mg/dL (ref 7–25)
CO2: 27 mmol/L (ref 20–32)
Calcium: 9.3 mg/dL (ref 8.6–10.3)
Chloride: 104 mmol/L (ref 98–110)
Creat: 1.04 mg/dL (ref 0.70–1.25)
GFR, Est African American: 88 mL/min/{1.73_m2} (ref >=60)
GFR, Est Non African American: 76 mL/min/{1.73_m2} (ref >=60)
Globulin: 2.2 g/dL (calc) (ref 1.9–3.7)
Glucose, Bld: 100 mg/dL — ABNORMAL HIGH (ref 65–99)
Potassium: 4.1 mmol/L (ref 3.5–5.3)
Sodium: 141 mmol/L (ref 135–146)
Total Bilirubin: 0.8 mg/dL (ref 0.2–1.2)
Total Protein: 6.8 g/dL (ref 6.1–8.1)

## 2019-11-08 LAB — CBC WITH DIFFERENTIAL/PLATELET
Absolute Monocytes: 239 cells/uL (ref 200–950)
Basophils Absolute: 31 cells/uL (ref 0–200)
Basophils Relative: 0.4 %
Eosinophils Absolute: 69 cells/uL (ref 15–500)
Eosinophils Relative: 0.9 %
HCT: 42.1 % (ref 38.5–50.0)
Hemoglobin: 13.6 g/dL (ref 13.2–17.1)
Lymphs Abs: 1232 cells/uL (ref 850–3900)
MCH: 29.4 pg (ref 27.0–33.0)
MCHC: 32.3 g/dL (ref 32.0–36.0)
MCV: 91.1 fL (ref 80.0–100.0)
MPV: 10.6 fL (ref 7.5–12.5)
Monocytes Relative: 3.1 %
Neutro Abs: 6129 cells/uL (ref 1500–7800)
Neutrophils Relative %: 79.6 %
Platelets: 162 10*3/uL (ref 140–400)
RBC: 4.62 10*6/uL (ref 4.20–5.80)
RDW: 12.7 % (ref 11.0–15.0)
Total Lymphocyte: 16 %
WBC: 7.7 10*3/uL (ref 3.8–10.8)

## 2019-11-08 LAB — PSA: PSA: 0.5 ng/mL (ref <=4.0)

## 2019-11-08 NOTE — Telephone Encounter (Signed)
A1C PERFECT!

## 2019-11-11 ENCOUNTER — Telehealth: Payer: Self-pay | Admitting: Neurology

## 2019-11-11 NOTE — Telephone Encounter (Signed)
Patient's wife called and left vm stating he is having more trouble with his knee. They have appt to see Dr. Berenice Primas on Thursday and want to get a copy of his Xray images. He had at [facility]. Called patient's wife and gave her the number to contact them back. [phone]. LMOM.

## 2019-11-14 ENCOUNTER — Other Ambulatory Visit: Payer: Self-pay | Admitting: Physician Assistant

## 2019-11-14 DIAGNOSIS — M25562 Pain in left knee: Secondary | ICD-10-CM | POA: Diagnosis not present

## 2019-11-14 DIAGNOSIS — E559 Vitamin D deficiency, unspecified: Secondary | ICD-10-CM

## 2019-11-25 ENCOUNTER — Telehealth: Payer: Self-pay | Admitting: Neurology

## 2019-11-25 NOTE — Telephone Encounter (Signed)
Patient's wife lmom stating some medications have to be sent to [facility] due to workers comp. Called to discussed, line rang with no answer, then rang busy.

## 2019-11-28 ENCOUNTER — Other Ambulatory Visit: Payer: Self-pay | Admitting: Physician Assistant

## 2019-11-28 DIAGNOSIS — E559 Vitamin D deficiency, unspecified: Secondary | ICD-10-CM

## 2019-12-03 ENCOUNTER — Ambulatory Visit: Payer: BC Managed Care – PPO | Admitting: Sports Medicine

## 2019-12-04 ENCOUNTER — Other Ambulatory Visit: Payer: Self-pay | Admitting: Physician Assistant

## 2019-12-04 DIAGNOSIS — F331 Major depressive disorder, recurrent, moderate: Secondary | ICD-10-CM

## 2019-12-12 DIAGNOSIS — M25562 Pain in left knee: Secondary | ICD-10-CM | POA: Diagnosis not present

## 2019-12-25 DIAGNOSIS — M25562 Pain in left knee: Secondary | ICD-10-CM | POA: Diagnosis not present

## 2019-12-25 DIAGNOSIS — M19072 Primary osteoarthritis, left ankle and foot: Secondary | ICD-10-CM | POA: Diagnosis not present

## 2019-12-25 DIAGNOSIS — M67874 Other specified disorders of tendon, left ankle and foot: Secondary | ICD-10-CM | POA: Diagnosis not present

## 2019-12-25 DIAGNOSIS — R600 Localized edema: Secondary | ICD-10-CM | POA: Diagnosis not present

## 2020-01-01 DIAGNOSIS — S83222A Peripheral tear of medial meniscus, current injury, left knee, initial encounter: Secondary | ICD-10-CM | POA: Diagnosis not present

## 2020-01-01 DIAGNOSIS — G8918 Other acute postprocedural pain: Secondary | ICD-10-CM | POA: Diagnosis not present

## 2020-01-01 DIAGNOSIS — Y999 Unspecified external cause status: Secondary | ICD-10-CM | POA: Diagnosis not present

## 2020-01-01 DIAGNOSIS — M94262 Chondromalacia, left knee: Secondary | ICD-10-CM | POA: Diagnosis not present

## 2020-01-01 DIAGNOSIS — S83282A Other tear of lateral meniscus, current injury, left knee, initial encounter: Secondary | ICD-10-CM | POA: Diagnosis not present

## 2020-01-01 DIAGNOSIS — M6752 Plica syndrome, left knee: Secondary | ICD-10-CM | POA: Diagnosis not present

## 2020-01-01 DIAGNOSIS — X58XXXA Exposure to other specified factors, initial encounter: Secondary | ICD-10-CM | POA: Diagnosis not present

## 2020-01-07 ENCOUNTER — Other Ambulatory Visit: Payer: Self-pay | Admitting: Physician Assistant

## 2020-01-07 DIAGNOSIS — J302 Other seasonal allergic rhinitis: Secondary | ICD-10-CM

## 2020-04-28 ENCOUNTER — Encounter: Payer: Self-pay | Admitting: Physician Assistant

## 2020-04-28 ENCOUNTER — Telehealth (INDEPENDENT_AMBULATORY_CARE_PROVIDER_SITE_OTHER): Payer: BC Managed Care – PPO | Admitting: Physician Assistant

## 2020-04-28 VITALS — BP 134/80

## 2020-04-28 DIAGNOSIS — U071 COVID-19: Secondary | ICD-10-CM | POA: Diagnosis not present

## 2020-04-28 NOTE — Addendum Note (Signed)
Addended by: Vivia Birmingham on: 04/28/2020 01:09 PM   Modules accepted: Orders

## 2020-04-28 NOTE — Progress Notes (Addendum)
Patient ID: Kenneth Duncan, male   DOB: 1956-01-21, 65 y.o.   MRN: [id number] .Marland KitchenVirtual Visit via Telephone Note  I connected with Kenneth Duncan on 04/28/20 at 11:10 AM EST by telephone and verified that I am speaking with the correct person using two identifiers.  Location: Patient: home Provider: clinic  .Marland KitchenParticipating in visit:  Patient: Kenneth Duncan Provider: Iran Planas PA-C    I discussed the limitations, risks, security and privacy concerns of performing an evaluation and management service by telephone and the availability of in person appointments. I also discussed with the patient that there may be a patient responsible charge related to this service. The patient expressed understanding and agreed to proceed.   History of Present Illness: Patient is a obese 65 year old male with celiac, herpetiform dermatitis who presents to the clinic with rapid positive COVID test.  His symptoms started Sunday with fever, body aches, chills and congestion.  On Monday he felt much worse and tested positive.  Patient is extremely fatigued, congested and has a headache.  His fevers have resolved.  He still feels very achy.  He denies any problems breathing or persistent cough.  He is taking Tylenol with minimal relief.  He does wonder about monoclonal antibodies and other medications.  He was vaccinated and had his booster.  .. Active Ambulatory Problems    Diagnosis Date Noted  . Chronic low back pain 06/14/2011  . Degenerative disc disease, lumbar 06/14/2011  . Hypertension 06/14/2011  . GERD (gastroesophageal reflux disease) 06/14/2011  . Anxiety disorder due to general medical condition 06/14/2011  . Celiac disease 06/14/2011  . B12 deficiency 07/28/2012  . Herpetiformis dermatitis 07/28/2012  . Hemorrhoid prolapse 08/15/2013  . OSA (obstructive sleep apnea) 08/24/2015  . Benign prostatic hyperplasia 10/21/2015  . No energy 10/21/2015  . Elevated LDL cholesterol level 10/28/2015   . Vitamin D deficiency 10/28/2015  . History of colon polyps 11/01/2016  . Angio-edema 03/07/2017  . Chronic SI joint pain 05/21/2017  . Lumbar herniated disc 05/23/2017  . Lumbar foraminal stenosis 05/23/2017  . Piriformis syndrome of both sides 10/09/2018  . Primary osteoarthritis of left knee 11/01/2019   Resolved Ambulatory Problems    Diagnosis Date Noted  . No Resolved Ambulatory Problems   No Additional Past Medical History   Reviewed med, allergy, problem list.      Observations/Objective: No acute distress Congested sounded voice Dry cough Speaking in full sentences.   .. Today's Vitals   04/28/20 1038  BP: 134/80   There is no height or weight on file to calculate BMI.    Assessment and Plan: Marland KitchenMarland KitchenDiagnoses and all orders for this visit:  COVID-19 virus infection   Patient does have a COVID risk of complications score of 4. He has HTN/celiac/obesity.  I did go ahead and refer him to infusion clinic however there is short supply and he likely will not make the cut.  He does seem to be fairly stable.  He does not have any lung involvement or shortness of breath at this time.  If that changes he can follow-up virtually and we can discuss other medications.  For the time being symptomatic care with Mucinex, ibuprofen, Tylenol encouraged.  Patient encouraged to start the COVID vitamins of vitamin C 500 mg, vitamin D 5000 units, zinc 50 mg.  He is to quarantine for at least 5 days and if he is symptomatic he may return to work with a mask.  If he is still symptomatic he needs to quarantine  for 10 days.  We can give him a note accordingly.  He is requesting PCR testing because his work requires this.  He is okay to come to the office to have a drive-by screening test.  Discussed side effects and severe reactions of COVID.  If he is worsening rapidly please go to ED.  Follow Up Instructions:    I discussed the assessment and treatment plan with the patient. The patient  was provided an opportunity to ask questions and all were answered. The patient agreed with the plan and demonstrated an understanding of the instructions.   The patient was advised to call back or seek an in-person evaluation if the symptoms worsen or if the condition fails to improve as anticipated.  I provided 10 minutes of non-face-to-face time during this encounter.   Iran Planas, PA-C

## 2020-04-28 NOTE — Progress Notes (Signed)
Pt stated the systems started on Sunday fever was 100.6 had chills feeling weak tired stomach pains tested positive for covid took tylenol. pt requesting for a phone call.  [facility] [place] [place]

## 2020-04-30 ENCOUNTER — Other Ambulatory Visit: Payer: Self-pay | Admitting: Physician Assistant

## 2020-04-30 DIAGNOSIS — K2100 Gastro-esophageal reflux disease with esophagitis, without bleeding: Secondary | ICD-10-CM

## 2020-05-01 ENCOUNTER — Telehealth: Payer: Self-pay

## 2020-05-01 LAB — NOVEL CORONAVIRUS, NAA: SARS-CoV-2, NAA: NOT DETECTED

## 2020-05-01 NOTE — Telephone Encounter (Signed)
Pt stated his PCR test was negative he needs a note for the 4 days that he was out of work.

## 2020-05-01 NOTE — Progress Notes (Signed)
PCR was negative for covid despite positive rapid. How are you feeling?

## 2020-05-01 NOTE — Telephone Encounter (Signed)
[place] for note for days missed due to viral infection.

## 2020-05-01 NOTE — Telephone Encounter (Signed)
Ok for note 

## 2020-05-01 NOTE — Telephone Encounter (Signed)
Spoke with patient and he said he was out from Tuesday to today so 04/28/20-05/01/20. AM

## 2020-05-01 NOTE — Telephone Encounter (Signed)
LM for pt to called back and verify what 4 days he needed for his return back to work note.

## 2020-05-05 NOTE — Telephone Encounter (Signed)
Spoke to pt he stated he no longer needs the note. He said he has everything straighten out.

## 2020-05-12 ENCOUNTER — Other Ambulatory Visit: Payer: Self-pay | Admitting: Neurology

## 2020-05-12 DIAGNOSIS — F331 Major depressive disorder, recurrent, moderate: Secondary | ICD-10-CM

## 2020-05-12 DIAGNOSIS — K2100 Gastro-esophageal reflux disease with esophagitis, without bleeding: Secondary | ICD-10-CM

## 2020-05-12 DIAGNOSIS — E538 Deficiency of other specified B group vitamins: Secondary | ICD-10-CM

## 2020-05-12 DIAGNOSIS — K900 Celiac disease: Secondary | ICD-10-CM

## 2020-05-12 DIAGNOSIS — E559 Vitamin D deficiency, unspecified: Secondary | ICD-10-CM

## 2020-05-12 DIAGNOSIS — Z13220 Encounter for screening for lipoid disorders: Secondary | ICD-10-CM

## 2020-05-12 DIAGNOSIS — Z125 Encounter for screening for malignant neoplasm of prostate: Secondary | ICD-10-CM

## 2020-05-12 DIAGNOSIS — I10 Essential (primary) hypertension: Secondary | ICD-10-CM

## 2020-05-12 DIAGNOSIS — Z131 Encounter for screening for diabetes mellitus: Secondary | ICD-10-CM

## 2020-05-12 DIAGNOSIS — J302 Other seasonal allergic rhinitis: Secondary | ICD-10-CM

## 2020-05-12 MED ORDER — CYANOCOBALAMIN 1000 MCG/ML IJ SOLN: 1000.0000 ug | INTRAMUSCULAR | 3 refills | Status: DC

## 2020-05-12 MED ORDER — VITAMIN D (ERGOCALCIFEROL) 1.25 MG (50000 UNIT) PO CAPS: 50000.0000 [IU] | ORAL_CAPSULE | ORAL | 0 refills | Status: DC

## 2020-08-11 DIAGNOSIS — Z09 Encounter for follow-up examination after completed treatment for conditions other than malignant neoplasm: Secondary | ICD-10-CM | POA: Diagnosis not present

## 2020-08-31 ENCOUNTER — Other Ambulatory Visit: Payer: Self-pay | Admitting: Neurology

## 2020-08-31 MED ORDER — IBUPROFEN 800 MG PO TABS: 800.0000 mg | ORAL_TABLET | Freq: Three times a day (TID) | ORAL | 1 refills | Status: DC | PRN

## 2020-08-31 NOTE — Telephone Encounter (Signed)
Patient's wife left a vm asking if we could send a prescription for Ibuprofen 800 mg BID. Patient takes it for his back and wanted an RX to file through insurance. Please advise?

## 2020-09-01 ENCOUNTER — Other Ambulatory Visit: Payer: Self-pay | Admitting: Physician Assistant

## 2020-09-01 DIAGNOSIS — E559 Vitamin D deficiency, unspecified: Secondary | ICD-10-CM

## 2020-09-08 DIAGNOSIS — Z09 Encounter for follow-up examination after completed treatment for conditions other than malignant neoplasm: Secondary | ICD-10-CM | POA: Diagnosis not present

## 2020-09-28 ENCOUNTER — Telehealth: Payer: Self-pay

## 2020-09-28 DIAGNOSIS — I10 Essential (primary) hypertension: Secondary | ICD-10-CM

## 2020-09-28 DIAGNOSIS — Z125 Encounter for screening for malignant neoplasm of prostate: Secondary | ICD-10-CM

## 2020-09-28 NOTE — Telephone Encounter (Signed)
Ok to start flomax .42m daily. Needs PSA, CBC, CMP drawn if not recently. Needs appt.

## 2020-09-28 NOTE — Telephone Encounter (Signed)
Mechele Claude called and states Kenneth Duncan in having a lot of nighttime urination. She wanted to know if he could get a refill on Flomax. No longer on medication list. I did advise to schedule an appointment. She wanted to know if he could go ahead and get the prescription. Please advise.

## 2020-09-29 MED ORDER — TAMSULOSIN HCL 0.4 MG PO CAPS: 0.4000 mg | ORAL_CAPSULE | Freq: Every day | ORAL | 3 refills | Status: DC

## 2020-09-29 NOTE — Telephone Encounter (Signed)
Farris's wife, Mechele Claude, advised. Labs ordered and medication sent.

## 2020-09-30 ENCOUNTER — Other Ambulatory Visit: Payer: Self-pay | Admitting: Physician Assistant

## 2020-09-30 DIAGNOSIS — E559 Vitamin D deficiency, unspecified: Secondary | ICD-10-CM

## 2020-10-09 ENCOUNTER — Telehealth: Payer: Self-pay | Admitting: Neurology

## 2020-10-09 NOTE — Telephone Encounter (Signed)
Received vm transferred to me from wife that patient needs appt with [facility]. Also due for labs. Please call patient to schedule appt and we can address all needs at that time.

## 2020-10-09 NOTE — Telephone Encounter (Signed)
Appointment has been scheduled for just a follow up? (Patient stated just a follow up with Kenneth Duncan) And to get blood work done on 10/21/20. AM

## 2020-10-19 ENCOUNTER — Other Ambulatory Visit: Payer: Self-pay | Admitting: Physician Assistant

## 2020-10-19 DIAGNOSIS — E559 Vitamin D deficiency, unspecified: Secondary | ICD-10-CM

## 2020-10-21 ENCOUNTER — Ambulatory Visit: Payer: BC Managed Care – PPO | Admitting: Physician Assistant

## 2020-10-21 ENCOUNTER — Other Ambulatory Visit: Payer: Self-pay

## 2020-10-21 VITALS — BP 148/86 | HR 61 | Ht 74.0 in | Wt 309.0 lb

## 2020-10-21 DIAGNOSIS — Z125 Encounter for screening for malignant neoplasm of prostate: Secondary | ICD-10-CM

## 2020-10-21 DIAGNOSIS — Z23 Encounter for immunization: Secondary | ICD-10-CM

## 2020-10-21 DIAGNOSIS — K900 Celiac disease: Secondary | ICD-10-CM | POA: Diagnosis not present

## 2020-10-21 DIAGNOSIS — E559 Vitamin D deficiency, unspecified: Secondary | ICD-10-CM

## 2020-10-21 DIAGNOSIS — E538 Deficiency of other specified B group vitamins: Secondary | ICD-10-CM

## 2020-10-21 DIAGNOSIS — Z13220 Encounter for screening for lipoid disorders: Secondary | ICD-10-CM

## 2020-10-21 DIAGNOSIS — Z0000 Encounter for general adult medical examination without abnormal findings: Secondary | ICD-10-CM

## 2020-10-21 DIAGNOSIS — K2100 Gastro-esophageal reflux disease with esophagitis, without bleeding: Secondary | ICD-10-CM | POA: Diagnosis not present

## 2020-10-21 DIAGNOSIS — F331 Major depressive disorder, recurrent, moderate: Secondary | ICD-10-CM

## 2020-10-21 DIAGNOSIS — J302 Other seasonal allergic rhinitis: Secondary | ICD-10-CM

## 2020-10-21 DIAGNOSIS — E7800 Pure hypercholesterolemia, unspecified: Secondary | ICD-10-CM

## 2020-10-21 DIAGNOSIS — Z131 Encounter for screening for diabetes mellitus: Secondary | ICD-10-CM

## 2020-10-21 DIAGNOSIS — I10 Essential (primary) hypertension: Secondary | ICD-10-CM

## 2020-10-21 MED ORDER — OLMESARTAN MEDOXOMIL 40 MG PO TABS: 40.0000 mg | ORAL_TABLET | Freq: Every day | ORAL | 4 refills | Status: DC

## 2020-10-21 MED ORDER — ATORVASTATIN CALCIUM 20 MG PO TABS: 20.0000 mg | ORAL_TABLET | Freq: Every day | ORAL | 4 refills | Status: DC

## 2020-10-21 MED ORDER — CYANOCOBALAMIN 1000 MCG/ML IJ SOLN: 1000.0000 ug | INTRAMUSCULAR | 3 refills | Status: DC

## 2020-10-21 MED ORDER — ESCITALOPRAM OXALATE 20 MG PO TABS: 20.0000 mg | ORAL_TABLET | Freq: Every day | ORAL | 3 refills | Status: DC

## 2020-10-21 MED ORDER — VITAMIN D (ERGOCALCIFEROL) 1.25 MG (50000 UNIT) PO CAPS: 50000.0000 [IU] | ORAL_CAPSULE | ORAL | 0 refills | Status: DC

## 2020-10-21 MED ORDER — OMEPRAZOLE 20 MG PO CPDR: 20.0000 mg | DELAYED_RELEASE_CAPSULE | Freq: Every day | ORAL | 3 refills | Status: DC

## 2020-10-21 NOTE — Patient Instructions (Signed)
Health Maintenance, Male Adopting a healthy lifestyle and getting preventive care are important in promoting health and wellness. Ask your health care provider about: The right schedule for you to have regular tests and exams. Things you can do on your own to prevent diseases and keep yourself healthy. What should I know about diet, weight, and exercise? Eat a healthy diet  Eat a diet that includes plenty of vegetables, fruits, low-fat dairy products, and lean protein. Do not eat a lot of foods that are high in solid fats, added sugars, or sodium.  Maintain a healthy weight Body mass index (BMI) is a measurement that can be used to identify possible weight problems. It estimates body fat based on height and weight. Your health care provider can help determine your BMI and help you achieve or maintain ahealthy weight. Get regular exercise Get regular exercise. This is one of the most important things you can do for your health. Most adults should: Exercise for at least 150 minutes each week. The exercise should increase your heart rate and make you sweat (moderate-intensity exercise). Do strengthening exercises at least twice a week. This is in addition to the moderate-intensity exercise. Spend less time sitting. Even light physical activity can be beneficial. Watch cholesterol and blood lipids Have your blood tested for lipids and cholesterol at 65 years of age, then havethis test every 5 years. You may need to have your cholesterol levels checked more often if: Your lipid or cholesterol levels are high. You are older than 65 years of age. You are at high risk for heart disease. What should I know about cancer screening? Many types of cancers can be detected early and may often be prevented. Depending on your health history and family history, you may need to have cancer screening at various ages. This may include screening for: Colorectal cancer. Prostate cancer. Skin cancer. Lung  cancer. What should I know about heart disease, diabetes, and high blood pressure? Blood pressure and heart disease High blood pressure causes heart disease and increases the risk of stroke. This is more likely to develop in people who have high blood pressure readings, are of African descent, or are overweight. Talk with your health care provider about your target blood pressure readings. Have your blood pressure checked: Every 3-5 years if you are 18-39 years of age. Every year if you are 40 years old or older. If you are between the ages of 65 and 75 and are a current or former smoker, ask your health care provider if you should have a one-time screening for abdominal aortic aneurysm (AAA). Diabetes Have regular diabetes screenings. This checks your fasting blood sugar level. Have the screening done: Once every three years after age 45 if you are at a normal weight and have a low risk for diabetes. More often and at a younger age if you are overweight or have a high risk for diabetes. What should I know about preventing infection? Hepatitis B If you have a higher risk for hepatitis B, you should be screened for this virus. Talk with your health care provider to find out if you are at risk forhepatitis B infection. Hepatitis C Blood testing is recommended for: Everyone born from 1945 through 1965. Anyone with known risk factors for hepatitis C. Sexually transmitted infections (STIs) You should be screened each year for STIs, including gonorrhea and chlamydia, if: You are sexually active and are younger than 65 years of age. You are older than 65 years of age   and your health care provider tells you that you are at risk for this type of infection. Your sexual activity has changed since you were last screened, and you are at increased risk for chlamydia or gonorrhea. Ask your health care provider if you are at risk. Ask your health care provider about whether you are at high risk for HIV.  Your health care provider may recommend a prescription medicine to help prevent HIV infection. If you choose to take medicine to prevent HIV, you should first get tested for HIV. You should then be tested every 3 months for as long as you are taking the medicine. Follow these instructions at home: Lifestyle Do not use any products that contain nicotine or tobacco, such as cigarettes, e-cigarettes, and chewing tobacco. If you need help quitting, ask your health care provider. Do not use street drugs. Do not share needles. Ask your health care provider for help if you need support or information about quitting drugs. Alcohol use Do not drink alcohol if your health care provider tells you not to drink. If you drink alcohol: Limit how much you have to 0-2 drinks a day. Be aware of how much alcohol is in your drink. In the [place] one drink equals one 12 oz bottle of beer (355 mL), one 5 oz glass of wine (148 mL), or one 1 oz glass of hard liquor (44 mL). General instructions Schedule regular health, dental, and eye exams. Stay current with your vaccines. Tell your health care provider if: You often feel depressed. You have ever been abused or do not feel safe at home. Summary Adopting a healthy lifestyle and getting preventive care are important in promoting health and wellness. Follow your health care provider's instructions about healthy diet, exercising, and getting tested or screened for diseases. Follow your health care provider's instructions on monitoring your cholesterol and blood pressure. This information is not intended to replace advice given to you by your health care provider. Make sure you discuss any questions you have with your healthcare provider. Document Revised: 03/28/2018 Document Reviewed: 03/28/2018 Elsevier Patient Education  2022 Elsevier Inc.  

## 2020-10-21 NOTE — Progress Notes (Signed)
Subjective:    Patient ID: Kenneth Duncan, male    DOB: 08/18/1955, 65 y.o.   MRN: [id number]  HPI  Patient is a 65 year old obese male with hypertension, OSA, celiac disease, GERD, MDD, chronic low back pain, vitamin D deficiency, vitamin B12 deficiency who presents to the clinic for medication refills and annual physical.  He is tired quite a bit as his only concern.  He denies any SI/HC. He does not feel depressed. He uses CPAP every night. No SOB, CP, palpitations, headaches.    Active Ambulatory Problems    Diagnosis Date Noted   Chronic low back pain 06/14/2011   Degenerative disc disease, lumbar 06/14/2011   Essential hypertension 06/14/2011   GERD (gastroesophageal reflux disease) 06/14/2011   Anxiety disorder due to general medical condition 06/14/2011   Celiac disease 06/14/2011   B12 deficiency 07/28/2012   Herpetiformis dermatitis 07/28/2012   Hemorrhoid prolapse 08/15/2013   OSA (obstructive sleep apnea) 08/24/2015   Benign prostatic hyperplasia 10/21/2015   No energy 10/21/2015   Elevated LDL cholesterol level 10/28/2015   Vitamin D deficiency 10/28/2015   History of colon polyps 11/01/2016   Angio-edema 03/07/2017   Chronic SI joint pain 05/21/2017   Lumbar herniated disc 05/23/2017   Lumbar foraminal stenosis 05/23/2017   Piriformis syndrome of both sides 10/09/2018   Primary osteoarthritis of left knee 11/01/2019   Seasonal allergies 10/27/2020   Gastroesophageal reflux disease with esophagitis 10/27/2020   Moderate episode of recurrent major depressive disorder ([place]) 10/27/2020   Resolved Ambulatory Problems    Diagnosis Date Noted   No Resolved Ambulatory Problems   Past Medical History:  Diagnosis Date   Hypertension    .Marland Kitchen Family History  Problem Relation Age of Onset   Uterine cancer Mother    Heart attack Mother 66   Heart attack Father    Diabetes Father    Heart disease Father    Diabetes Sister    Heart disease Sister    Heart attack  Sister    Lung cancer Brother    Lymphoma Brother    Uterine cancer Sister    .Marland Kitchen Social History   Socioeconomic History   Marital status: Married    Spouse name: Not on file   Number of children: Not on file   Years of education: Not on file   Highest education level: Not on file  Occupational History   Not on file  Tobacco Use   Smoking status: Never   Smokeless tobacco: Never  Substance and Sexual Activity   Alcohol use: No   Drug use: No   Sexual activity: Yes    Partners: Female  Other Topics Concern   Not on file  Social History Narrative   Not on file   Social Determinants of Health   Financial Resource Strain: Not on file  Food Insecurity: Not on file  Transportation Needs: Not on file  Physical Activity: Not on file  Stress: Not on file  Social Connections: Not on file  Intimate Partner Violence: Not on file      Review of Systems  All other systems reviewed and are negative.     Objective:   Physical Exam BP (!) 148/86   Pulse 61   Ht 6' 2"  (1.88 m)   Wt (!) 309 lb (140.2 kg)   SpO2 95%   BMI 39.67 kg/m   General Appearance:    Alert, cooperative, no distress, appears stated age, obese  Head:    Normocephalic, without  obvious abnormality, atraumatic  Eyes:    PERRL, conjunctiva/corneas clear, EOM's intact, fundi    benign, both eyes       Ears:    Normal TM's and external ear canals, both ears  Nose:   Nares normal, septum midline, mucosa normal, no drainage    or sinus tenderness  Throat:   Lips, mucosa, and tongue normal; teeth and gums normal  Neck:   Supple, symmetrical, trachea midline, no adenopathy;       thyroid:  No enlargement/tenderness/nodules; no carotid   bruit or JVD  Back:     Symmetric, no curvature, ROM normal, no CVA tenderness  Lungs:     Clear to auscultation bilaterally, respirations unlabored  Chest wall:    No tenderness or deformity  Heart:    Regular rate and rhythm, S1 and S2 normal, no murmur, rub   or gallop   Abdomen:     Soft, non-tender, bowel sounds active all four quadrants,    no masses, no organomegaly        Extremities:   Extremities normal, atraumatic, no cyanosis or edema  Pulses:   2+ and symmetric all extremities  Skin:   Skin color, texture, turgor normal, no rashes or lesions  Lymph nodes:   Cervical, supraclavicular, and axillary nodes normal  Neurologic:   CNII-XII intact. Normal strength, sensation and reflexes      throughout     .Marland Kitchen Depression screen [facility] 2/9 10/21/2020 05/17/2017 07/08/2016  Decreased Interest 0 0 0  Down, Depressed, Hopeless 0 0 0  PHQ - 2 Score 0 0 0  Altered sleeping 2 - -  Tired, decreased energy 1 - -  Change in appetite 0 - -  Feeling bad or failure about yourself  0 - -  Trouble concentrating 0 - -  Moving slowly or fidgety/restless 0 - -  Suicidal thoughts 0 - -  PHQ-9 Score 3 - -  Difficult doing work/chores Not difficult at all - -   .Marland Kitchen GAD 7 : Generalized Anxiety Score 10/21/2020  Nervous, Anxious, on Edge 0  Control/stop worrying 0  Worry too much - different things 0  Trouble relaxing 0  Restless 0  Easily annoyed or irritable 0  Afraid - awful might happen 0  Total GAD 7 Score 0  Anxiety Difficulty Not difficult at all    .Marland Kitchen  [place] Name 10/21/20 1000         During the last Month   Sensation of Bladder not Empty Less than half the time     Urinate<2 hours after last Less than half the time     Mult. stop/start when voiding Not at all     Difficult to postpone voiding Less than 1 time in 5     Weak urinary stream Less than 1 time in 5     Push/strain to begin urination Not at all     Times per night up to urinate Less than half the time           OTHER     Total Score 8                Assessment & Plan:  Marland KitchenMarland KitchenRyan was seen today for follow-up.  Diagnoses and all orders for this visit:  Routine physical examination -     PSA -     CBC with Differential/Platelet -     COMPLETE METABOLIC  PANEL WITH GFR -  Lipid Panel w/reflex Direct LDL -     TSH -     B12 and Folate Panel -     VITAMIN D 25 Hydroxy (Vit-D Deficiency, Fractures) -     Fe+TIBC+Fer  Moderate episode of recurrent major depressive disorder (HCC) -     escitalopram (LEXAPRO) 20 MG tablet; Take 1 tablet (20 mg total) by mouth daily.  Gastroesophageal reflux disease with esophagitis -     cyanocobalamin (,VITAMIN B-12,) 1000 MCG/ML injection; Inject 1 mL (1,000 mcg total) into the muscle every 30 (thirty) days. -     omeprazole (PRILOSEC) 20 MG capsule; Take 1 capsule (20 mg total) by mouth daily.  Celiac disease -     cyanocobalamin (,VITAMIN B-12,) 1000 MCG/ML injection; Inject 1 mL (1,000 mcg total) into the muscle every 30 (thirty) days. -     CBC with Differential/Platelet -     B12 and Folate Panel -     VITAMIN D 25 Hydroxy (Vit-D Deficiency, Fractures)  Screening for lipid disorders  Screening for diabetes mellitus -     cyanocobalamin (,VITAMIN B-12,) 1000 MCG/ML injection; Inject 1 mL (1,000 mcg total) into the muscle every 30 (thirty) days.  B12 deficiency -     cyanocobalamin (,VITAMIN B-12,) 1000 MCG/ML injection; Inject 1 mL (1,000 mcg total) into the muscle every 30 (thirty) days. -     VITAMIN D 25 Hydroxy (Vit-D Deficiency, Fractures) -     Fe+TIBC+Fer  Vitamin D insufficiency -     Vitamin D, Ergocalciferol, (DRISDOL) 1.25 MG (50000 UNIT) CAPS capsule; Take 1 capsule (50,000 Units total) by mouth every 7 (seven) days. Needs labs -     VITAMIN D 25 Hydroxy (Vit-D Deficiency, Fractures)  Prostate cancer screening -     PSA  Essential hypertension -     olmesartan (BENICAR) 40 MG tablet; Take 1 tablet (40 mg total) by mouth daily. -     COMPLETE METABOLIC PANEL WITH GFR  Seasonal allergies  Vitamin D deficiency  Need for shingles vaccine -     Varicella-zoster vaccine IM  Need for pneumococcal vaccine -     Pneumococcal conjugate vaccine 20-valent (Prevnar 20)  Elevated  LDL cholesterol level -     atorvastatin (LIPITOR) 20 MG tablet; Take 1 tablet (20 mg total) by mouth daily.    Marland Kitchen.Start a regular exercise program and make sure you are eating a healthy diet Try to eat 4 servings of dairy a day or take a calcium supplement (539m twice a day). Covid vaccine x3. Needs last booster.  Prevnar 20 and shingrix given today.  Fasting labs ordered.  AUA elevated some. PSA ordered. Declined treatment at this time.  Colonoscopy UTD.

## 2020-10-27 ENCOUNTER — Encounter: Payer: Self-pay | Admitting: Physician Assistant

## 2020-10-27 DIAGNOSIS — F331 Major depressive disorder, recurrent, moderate: Secondary | ICD-10-CM | POA: Insufficient documentation

## 2020-10-27 DIAGNOSIS — K2100 Gastro-esophageal reflux disease with esophagitis, without bleeding: Secondary | ICD-10-CM | POA: Insufficient documentation

## 2020-10-27 DIAGNOSIS — J302 Other seasonal allergic rhinitis: Secondary | ICD-10-CM | POA: Insufficient documentation

## 2020-11-07 ENCOUNTER — Other Ambulatory Visit: Payer: Self-pay | Admitting: Physician Assistant

## 2020-11-07 DIAGNOSIS — J302 Other seasonal allergic rhinitis: Secondary | ICD-10-CM

## 2020-11-10 ENCOUNTER — Other Ambulatory Visit: Payer: Self-pay | Admitting: Physician Assistant

## 2020-11-10 DIAGNOSIS — E559 Vitamin D deficiency, unspecified: Secondary | ICD-10-CM

## 2020-11-13 DIAGNOSIS — E538 Deficiency of other specified B group vitamins: Secondary | ICD-10-CM | POA: Diagnosis not present

## 2020-11-13 DIAGNOSIS — I10 Essential (primary) hypertension: Secondary | ICD-10-CM | POA: Diagnosis not present

## 2020-11-13 DIAGNOSIS — K900 Celiac disease: Secondary | ICD-10-CM | POA: Diagnosis not present

## 2020-11-13 DIAGNOSIS — E559 Vitamin D deficiency, unspecified: Secondary | ICD-10-CM | POA: Diagnosis not present

## 2020-11-13 DIAGNOSIS — Z125 Encounter for screening for malignant neoplasm of prostate: Secondary | ICD-10-CM | POA: Diagnosis not present

## 2020-11-13 LAB — COMPLETE METABOLIC PANEL WITH GFR
AG Ratio: 1.7 (calc) (ref 1.0–2.5)
ALT: 24 U/L (ref 9–46)
AST: 18 U/L (ref 10–35)
Albumin: 4.4 g/dL (ref 3.6–5.1)
Alkaline phosphatase (APISO): 108 U/L (ref 35–144)
BUN: 16 mg/dL (ref 7–25)
CO2: 32 mmol/L (ref 20–32)
Calcium: 9.7 mg/dL (ref 8.6–10.3)
Chloride: 105 mmol/L (ref 98–110)
Creat: 1.22 mg/dL (ref 0.70–1.35)
Globulin: 2.6 g/dL (calc) (ref 1.9–3.7)
Glucose, Bld: 91 mg/dL (ref 65–99)
Potassium: 5 mmol/L (ref 3.5–5.3)
Sodium: 143 mmol/L (ref 135–146)
Total Bilirubin: 0.9 mg/dL (ref 0.2–1.2)
Total Protein: 7 g/dL (ref 6.1–8.1)
eGFR: 66 mL/min/{1.73_m2} (ref >=60)

## 2020-11-13 LAB — CBC WITH DIFFERENTIAL/PLATELET
Absolute Monocytes: 577 cells/uL (ref 200–950)
Basophils Absolute: 47 cells/uL (ref 0–200)
Basophils Relative: 0.5 %
Eosinophils Absolute: 112 cells/uL (ref 15–500)
Eosinophils Relative: 1.2 %
HCT: 44.3 % (ref 38.5–50.0)
Hemoglobin: 14.3 g/dL (ref 13.2–17.1)
Lymphs Abs: 1897 cells/uL (ref 850–3900)
MCH: 29.5 pg (ref 27.0–33.0)
MCHC: 32.3 g/dL (ref 32.0–36.0)
MCV: 91.5 fL (ref 80.0–100.0)
MPV: 10.5 fL (ref 7.5–12.5)
Monocytes Relative: 6.2 %
Neutro Abs: 6668 cells/uL (ref 1500–7800)
Neutrophils Relative %: 71.7 %
Platelets: 174 10*3/uL (ref 140–400)
RBC: 4.84 10*6/uL (ref 4.20–5.80)
RDW: 12.7 % (ref 11.0–15.0)
Total Lymphocyte: 20.4 %
WBC: 9.3 10*3/uL (ref 3.8–10.8)

## 2020-11-13 LAB — B12 AND FOLATE PANEL
Folate: 6.1 ng/mL
Vitamin B-12: 287 pg/mL (ref 200–1100)

## 2020-11-13 LAB — LIPID PANEL W/REFLEX DIRECT LDL
Cholesterol: 151 mg/dL (ref <200)
HDL: 49 mg/dL (ref >=40)
LDL Cholesterol (Calc): 79 mg/dL (calc)
Non-HDL Cholesterol (Calc): 102 mg/dL (calc) (ref <130)
Total CHOL/HDL Ratio: 3.1 (calc) (ref <5.0)
Triglycerides: 133 mg/dL (ref <150)

## 2020-11-13 LAB — VITAMIN D 25 HYDROXY (VIT D DEFICIENCY, FRACTURES): Vit D, 25-Hydroxy: 45 ng/mL (ref 30–100)

## 2020-11-13 LAB — PSA: PSA: 1 ng/mL (ref <=4.00)

## 2020-11-13 LAB — IRON,TIBC AND FERRITIN PANEL
%SAT: 26 % (calc) (ref 20–48)
Ferritin: 283 ng/mL (ref 24–380)
Iron: 79 ug/dL (ref 50–180)
TIBC: 308 mcg/dL (calc) (ref 250–425)

## 2020-11-13 LAB — TSH: TSH: 2.26 mIU/L (ref 0.40–4.50)

## 2020-11-16 NOTE — Progress Notes (Signed)
Kenneth Duncan,   PSA up some from 1 year ago but well within normal range.  Kidney, liver, glucose look great.  Cholesterol looks great and down from 1 year ago.  Thyroid normal range.  Folate looks great.  Vit b12 on low side of normal. Are you taking any b12 shots monthly? Have you missed any recent doses?  Vitamin d MUCH better.  Iron panel looks good.

## 2020-11-19 DIAGNOSIS — Z1283 Encounter for screening for malignant neoplasm of skin: Secondary | ICD-10-CM | POA: Diagnosis not present

## 2020-11-19 DIAGNOSIS — L820 Inflamed seborrheic keratosis: Secondary | ICD-10-CM | POA: Diagnosis not present

## 2020-12-22 ENCOUNTER — Telehealth: Payer: Self-pay | Admitting: Physician Assistant

## 2020-12-22 NOTE — Telephone Encounter (Signed)
[facility] called 12/19/2020 with positive home covid test and symptoms. Day 3 of symptoms. High risk of complications. GFR above 60. Sent paxlovid for 5 days. Follow up as needed in office.

## 2020-12-24 DIAGNOSIS — U071 COVID-19: Secondary | ICD-10-CM | POA: Diagnosis not present

## 2020-12-24 DIAGNOSIS — Z20822 Contact with and (suspected) exposure to covid-19: Secondary | ICD-10-CM | POA: Diagnosis not present

## 2020-12-25 ENCOUNTER — Other Ambulatory Visit: Payer: Self-pay | Admitting: Physician Assistant

## 2021-03-31 ENCOUNTER — Telehealth: Payer: Self-pay | Admitting: Neurology

## 2021-03-31 NOTE — Telephone Encounter (Signed)
Patient's wife called stating patient needs colonoscopy referral. Patient okay til August next year.

## 2021-05-03 DIAGNOSIS — M461 Sacroiliitis, not elsewhere classified: Secondary | ICD-10-CM | POA: Diagnosis not present

## 2021-06-14 DIAGNOSIS — G894 Chronic pain syndrome: Secondary | ICD-10-CM | POA: Diagnosis not present

## 2021-06-14 DIAGNOSIS — M4726 Other spondylosis with radiculopathy, lumbar region: Secondary | ICD-10-CM | POA: Diagnosis not present

## 2021-06-14 DIAGNOSIS — M5459 Other low back pain: Secondary | ICD-10-CM | POA: Diagnosis not present

## 2021-06-14 DIAGNOSIS — M461 Sacroiliitis, not elsewhere classified: Secondary | ICD-10-CM | POA: Diagnosis not present

## 2021-07-17 ENCOUNTER — Other Ambulatory Visit: Payer: Self-pay | Admitting: Physician Assistant

## 2021-07-17 DIAGNOSIS — K2100 Gastro-esophageal reflux disease with esophagitis, without bleeding: Secondary | ICD-10-CM

## 2021-07-17 DIAGNOSIS — E538 Deficiency of other specified B group vitamins: Secondary | ICD-10-CM

## 2021-07-17 DIAGNOSIS — K900 Celiac disease: Secondary | ICD-10-CM

## 2021-07-17 DIAGNOSIS — E559 Vitamin D deficiency, unspecified: Secondary | ICD-10-CM

## 2021-07-17 DIAGNOSIS — Z131 Encounter for screening for diabetes mellitus: Secondary | ICD-10-CM

## 2021-07-17 DIAGNOSIS — J302 Other seasonal allergic rhinitis: Secondary | ICD-10-CM

## 2021-07-27 DIAGNOSIS — M461 Sacroiliitis, not elsewhere classified: Secondary | ICD-10-CM | POA: Diagnosis not present

## 2021-07-27 DIAGNOSIS — M47816 Spondylosis without myelopathy or radiculopathy, lumbar region: Secondary | ICD-10-CM | POA: Diagnosis not present

## 2021-08-24 ENCOUNTER — Other Ambulatory Visit: Payer: Self-pay

## 2021-08-24 DIAGNOSIS — I10 Essential (primary) hypertension: Secondary | ICD-10-CM

## 2021-08-24 MED ORDER — OLMESARTAN MEDOXOMIL 40 MG PO TABS: 40.0000 mg | ORAL_TABLET | Freq: Every day | ORAL | 0 refills | Status: DC

## 2021-10-15 ENCOUNTER — Other Ambulatory Visit: Payer: Self-pay | Admitting: Physician Assistant

## 2021-10-15 ENCOUNTER — Telehealth: Payer: Self-pay | Admitting: Neurology

## 2021-10-15 DIAGNOSIS — F331 Major depressive disorder, recurrent, moderate: Secondary | ICD-10-CM

## 2021-10-15 NOTE — Telephone Encounter (Signed)
Patient's wife left vm asking if we sent refills on medication, but wouldn't really pronounce medication, very unclear. Called back, LVM for them to call pharmacy to send [place] a refill request.

## 2021-10-18 ENCOUNTER — Other Ambulatory Visit: Payer: Self-pay | Admitting: Physician Assistant

## 2021-10-18 DIAGNOSIS — K2100 Gastro-esophageal reflux disease with esophagitis, without bleeding: Secondary | ICD-10-CM

## 2021-11-11 ENCOUNTER — Other Ambulatory Visit: Payer: Self-pay | Admitting: Physician Assistant

## 2021-11-11 DIAGNOSIS — I10 Essential (primary) hypertension: Secondary | ICD-10-CM

## 2021-11-12 ENCOUNTER — Other Ambulatory Visit: Payer: Self-pay

## 2021-11-12 DIAGNOSIS — I10 Essential (primary) hypertension: Secondary | ICD-10-CM

## 2021-11-12 MED ORDER — OLMESARTAN MEDOXOMIL 40 MG PO TABS: 40.0000 mg | ORAL_TABLET | Freq: Every day | ORAL | 0 refills | Status: DC

## 2021-11-23 ENCOUNTER — Other Ambulatory Visit: Payer: Self-pay | Admitting: Neurology

## 2021-11-23 DIAGNOSIS — E7800 Pure hypercholesterolemia, unspecified: Secondary | ICD-10-CM

## 2021-11-23 MED ORDER — ATORVASTATIN CALCIUM 20 MG PO TABS: 20.0000 mg | ORAL_TABLET | Freq: Every day | ORAL | 0 refills | Status: DC

## 2021-12-16 IMAGING — DX DG KNEE 1-2V*R*
2 series · 2 of 2 positions shown · non-contrast
Comparison: None.

CLINICAL DATA: Left knee injury.

EXAM:
RIGHT KNEE - 1-2 VIEW

[tunnel]
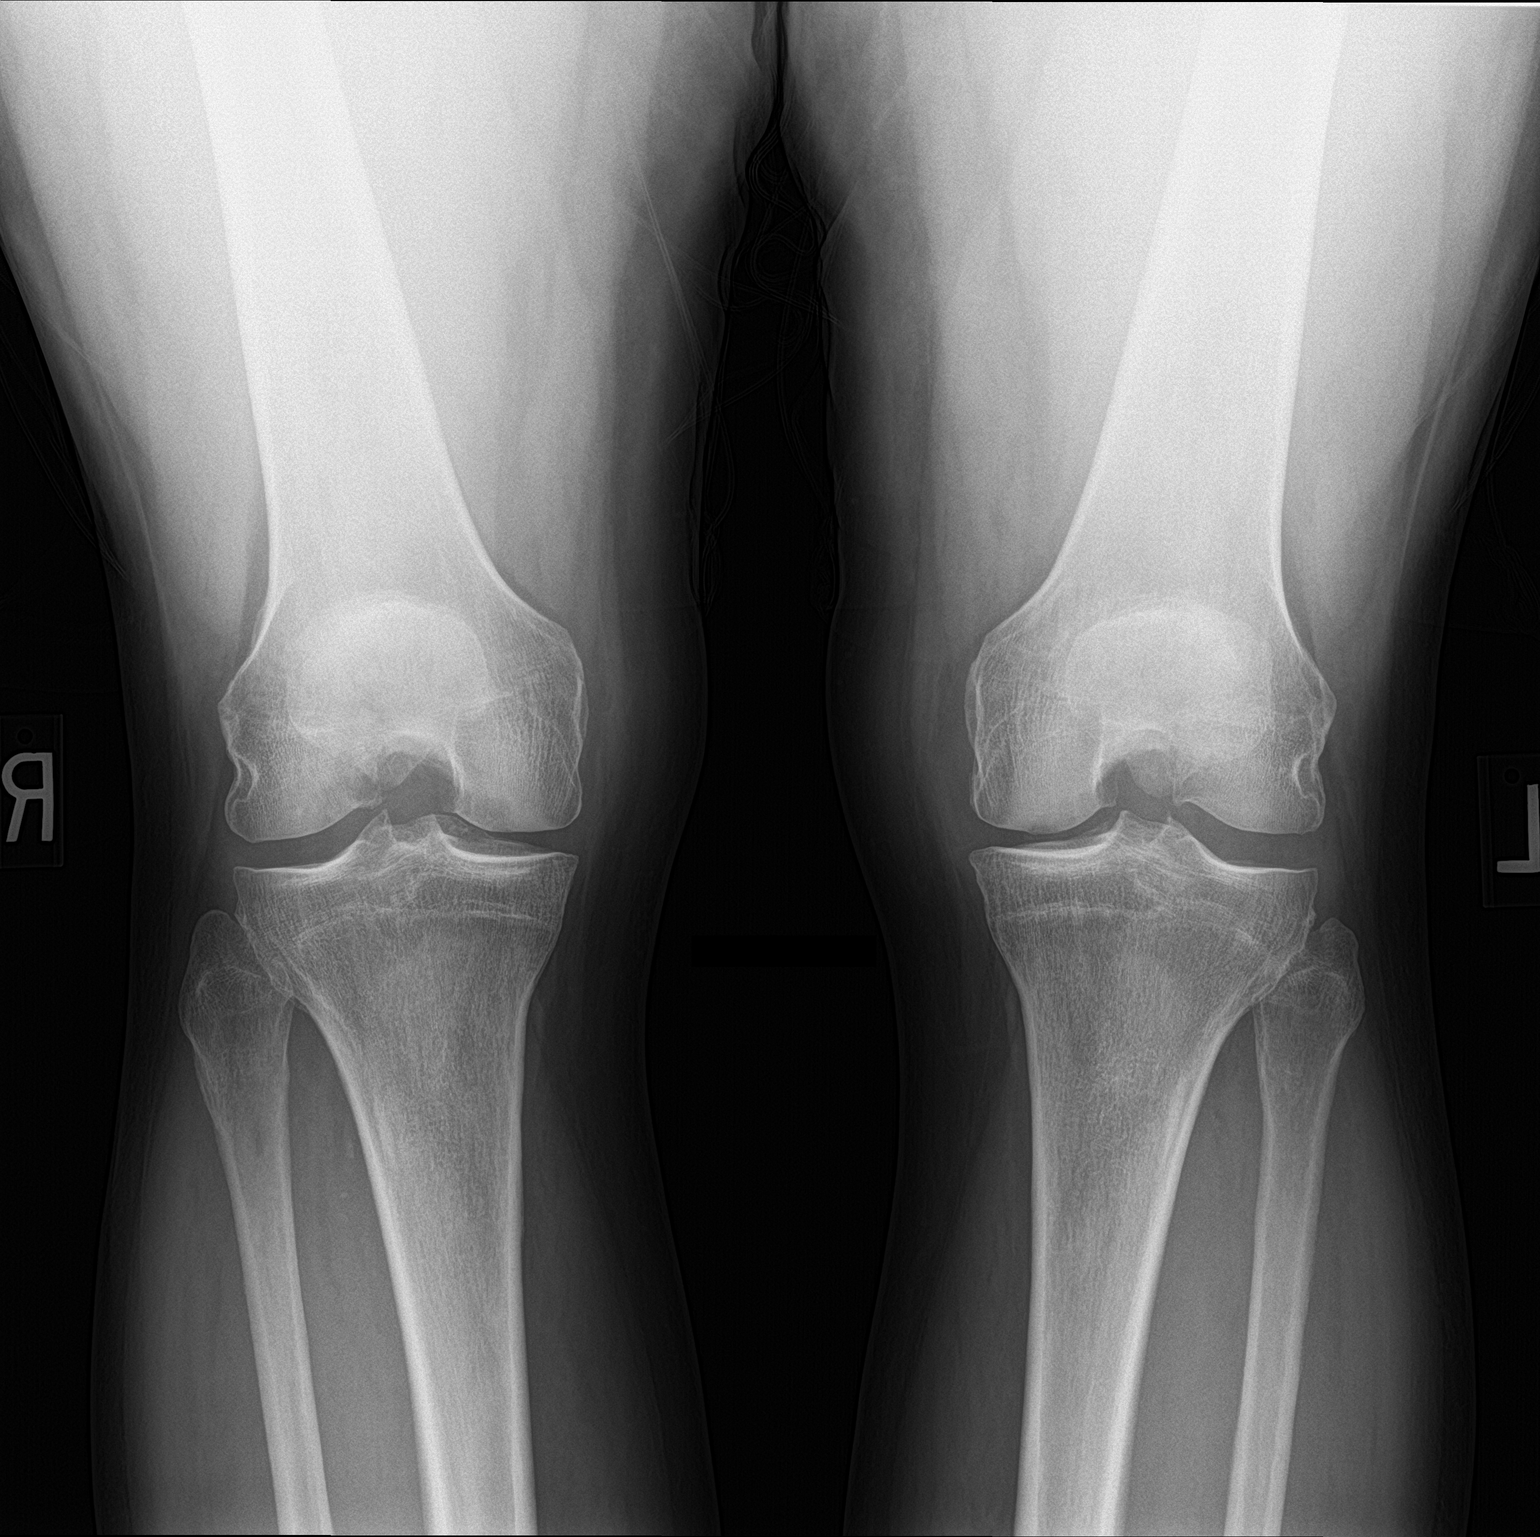

[knee ap bilat standing]
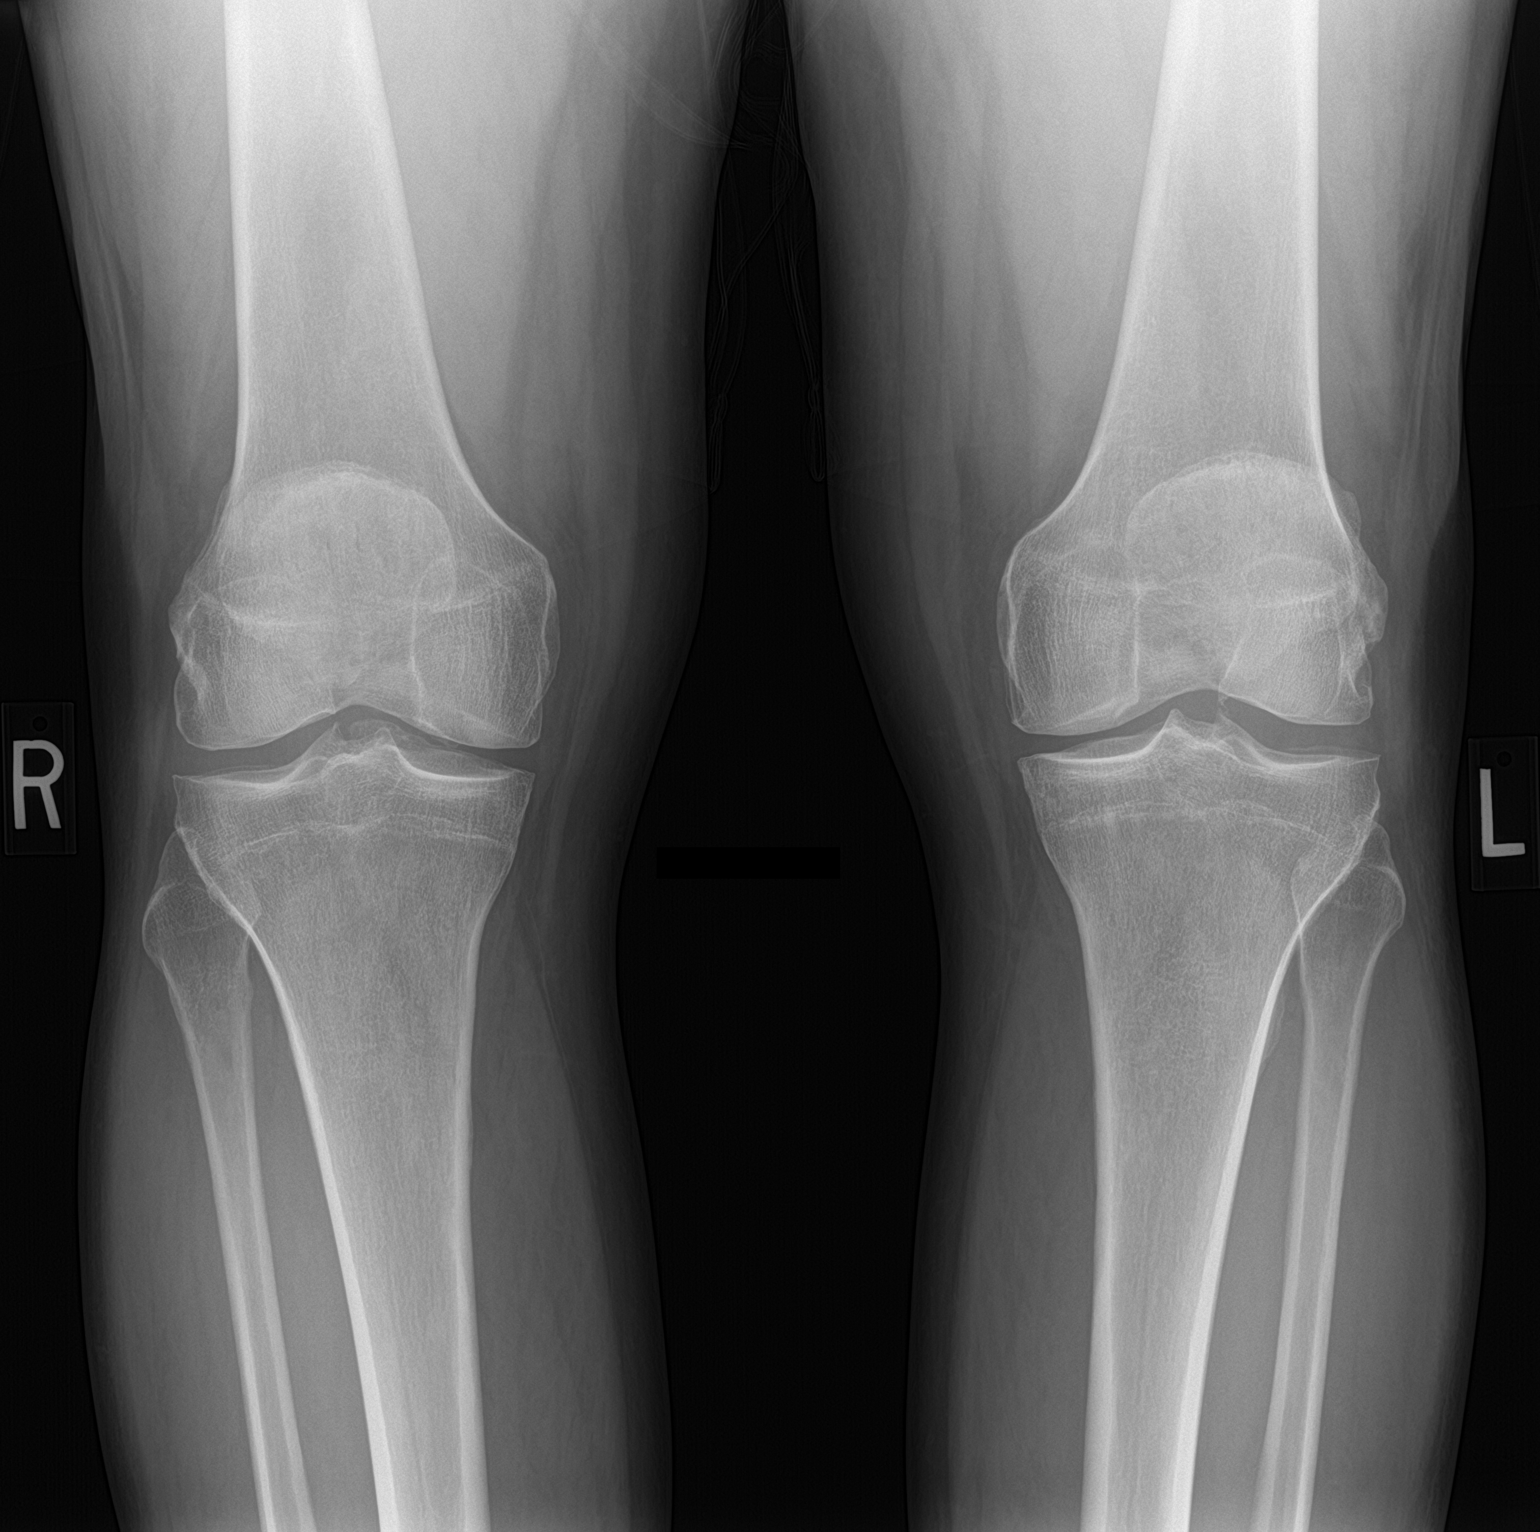

[2 of 2 positions shown; findings below may reference images not displayed]

FINDINGS: No evidence of fracture or dislocation. No evidence of arthropathy
or other focal bone abnormality. Soft tissues are unremarkable.
IMPRESSION: Negative.

## 2021-12-30 ENCOUNTER — Other Ambulatory Visit: Payer: Self-pay | Admitting: Neurology

## 2021-12-30 DIAGNOSIS — K900 Celiac disease: Secondary | ICD-10-CM

## 2021-12-30 MED ORDER — DAPSONE 100 MG PO TABS: 100.0000 mg | ORAL_TABLET | Freq: Two times a day (BID) | ORAL | 0 refills | Status: DC

## 2022-01-14 ENCOUNTER — Other Ambulatory Visit: Payer: Self-pay | Admitting: Physician Assistant

## 2022-01-14 DIAGNOSIS — K2100 Gastro-esophageal reflux disease with esophagitis, without bleeding: Secondary | ICD-10-CM

## 2022-01-15 ENCOUNTER — Other Ambulatory Visit: Payer: Self-pay | Admitting: Physician Assistant

## 2022-01-15 DIAGNOSIS — F331 Major depressive disorder, recurrent, moderate: Secondary | ICD-10-CM

## 2022-01-20 ENCOUNTER — Ambulatory Visit: Payer: BC Managed Care – PPO | Admitting: Physician Assistant

## 2022-01-20 ENCOUNTER — Encounter: Payer: Self-pay | Admitting: Physician Assistant

## 2022-01-20 VITALS — BP 135/90 | HR 66 | Ht 73.0 in | Wt 305.2 lb

## 2022-01-20 DIAGNOSIS — J302 Other seasonal allergic rhinitis: Secondary | ICD-10-CM

## 2022-01-20 DIAGNOSIS — E538 Deficiency of other specified B group vitamins: Secondary | ICD-10-CM | POA: Diagnosis not present

## 2022-01-20 DIAGNOSIS — I10 Essential (primary) hypertension: Secondary | ICD-10-CM

## 2022-01-20 DIAGNOSIS — Z0000 Encounter for general adult medical examination without abnormal findings: Secondary | ICD-10-CM

## 2022-01-20 DIAGNOSIS — E559 Vitamin D deficiency, unspecified: Secondary | ICD-10-CM

## 2022-01-20 DIAGNOSIS — Z13220 Encounter for screening for lipoid disorders: Secondary | ICD-10-CM

## 2022-01-20 DIAGNOSIS — K2100 Gastro-esophageal reflux disease with esophagitis, without bleeding: Secondary | ICD-10-CM

## 2022-01-20 DIAGNOSIS — Z1329 Encounter for screening for other suspected endocrine disorder: Secondary | ICD-10-CM

## 2022-01-20 DIAGNOSIS — E7800 Pure hypercholesterolemia, unspecified: Secondary | ICD-10-CM

## 2022-01-20 DIAGNOSIS — K900 Celiac disease: Secondary | ICD-10-CM

## 2022-01-20 DIAGNOSIS — Z23 Encounter for immunization: Secondary | ICD-10-CM | POA: Diagnosis not present

## 2022-01-20 DIAGNOSIS — F331 Major depressive disorder, recurrent, moderate: Secondary | ICD-10-CM

## 2022-01-20 DIAGNOSIS — Z131 Encounter for screening for diabetes mellitus: Secondary | ICD-10-CM

## 2022-01-20 DIAGNOSIS — Z79899 Other long term (current) drug therapy: Secondary | ICD-10-CM

## 2022-01-20 DIAGNOSIS — Z125 Encounter for screening for malignant neoplasm of prostate: Secondary | ICD-10-CM

## 2022-01-20 MED ORDER — ESCITALOPRAM OXALATE 20 MG PO TABS: 20.0000 mg | ORAL_TABLET | Freq: Every day | ORAL | 3 refills | Status: DC

## 2022-01-20 MED ORDER — OLMESARTAN MEDOXOMIL 40 MG PO TABS: 40.0000 mg | ORAL_TABLET | Freq: Every day | ORAL | 3 refills | Status: DC

## 2022-01-20 MED ORDER — FLUTICASONE PROPIONATE 50 MCG/ACT NA SUSP: NASAL | 3 refills | Status: DC

## 2022-01-20 MED ORDER — DAPSONE 100 MG PO TABS: 100.0000 mg | ORAL_TABLET | Freq: Two times a day (BID) | ORAL | 3 refills | Status: DC

## 2022-01-20 MED ORDER — OMEPRAZOLE 20 MG PO CPDR: DELAYED_RELEASE_CAPSULE | ORAL | 3 refills | Status: DC

## 2022-01-20 MED ORDER — TRIAMCINOLONE ACETONIDE 0.1 % EX CREA: 1.0000 | TOPICAL_CREAM | Freq: Two times a day (BID) | CUTANEOUS | 1 refills | Status: AC

## 2022-01-20 MED ORDER — VITAMIN D (ERGOCALCIFEROL) 1.25 MG (50000 UNIT) PO CAPS: 50000.0000 [IU] | ORAL_CAPSULE | ORAL | 3 refills | Status: DC

## 2022-01-20 MED ORDER — CYANOCOBALAMIN 1000 MCG/ML IJ SOLN: 1000.0000 ug | INTRAMUSCULAR | 3 refills | Status: DC

## 2022-01-20 MED ORDER — ATORVASTATIN CALCIUM 20 MG PO TABS: 20.0000 mg | ORAL_TABLET | Freq: Every day | ORAL | 3 refills | Status: DC

## 2022-01-20 NOTE — Patient Instructions (Signed)
Medications sent to pharmacy!  Get labs  Health Maintenance After Age 66 After age 26, you are at a higher risk for certain long-term diseases and infections as well as injuries from falls. Falls are a major cause of broken bones and head injuries in people who are older than age 24. Getting regular preventive care can help to keep you healthy and well. Preventive care includes getting regular testing and making lifestyle changes as recommended by your health care provider. Talk with your health care provider about: Which screenings and tests you should have. A screening is a test that checks for a disease when you have no symptoms. A diet and exercise plan that is right for you. What should I know about screenings and tests to prevent falls? Screening and testing are the best ways to find a health problem early. Early diagnosis and treatment give you the best chance of managing medical conditions that are common after age 66. Certain conditions and lifestyle choices may make you more likely to have a fall. Your health care provider may recommend: Regular vision checks. Poor vision and conditions such as cataracts can make you more likely to have a fall. If you wear glasses, make sure to get your prescription updated if your vision changes. Medicine review. Work with your health care provider to regularly review all of the medicines you are taking, including over-the-counter medicines. Ask your health care provider about any side effects that may make you more likely to have a fall. Tell your health care provider if any medicines that you take make you feel dizzy or sleepy. Strength and balance checks. Your health care provider may recommend certain tests to check your strength and balance while standing, walking, or changing positions. Foot health exam. Foot pain and numbness, as well as not wearing proper footwear, can make you more likely to have a fall. Screenings, including: Osteoporosis  screening. Osteoporosis is a condition that causes the bones to get weaker and break more easily. Blood pressure screening. Blood pressure changes and medicines to control blood pressure can make you feel dizzy. Depression screening. You may be more likely to have a fall if you have a fear of falling, feel depressed, or feel unable to do activities that you used to do. Alcohol use screening. Using too much alcohol can affect your balance and may make you more likely to have a fall. Follow these instructions at home: Lifestyle Do not drink alcohol if: Your health care provider tells you not to drink. If you drink alcohol: Limit how much you have to: 0-1 drink a day for women. 0-2 drinks a day for men. Know how much alcohol is in your drink. In the [place] one drink equals one 12 oz bottle of beer (355 mL), one 5 oz glass of wine (148 mL), or one 1 oz glass of hard liquor (44 mL). Do not use any products that contain nicotine or tobacco. These products include cigarettes, chewing tobacco, and vaping devices, such as e-cigarettes. If you need help quitting, ask your health care provider. Activity  Follow a regular exercise program to stay fit. This will help you maintain your balance. Ask your health care provider what types of exercise are appropriate for you. If you need a cane or walker, use it as recommended by your health care provider. Wear supportive shoes that have nonskid soles. Safety  Remove any tripping hazards, such as rugs, cords, and clutter. Install safety equipment such as grab bars in bathrooms and  safety rails on stairs. Keep rooms and walkways well-lit. General instructions Talk with your health care provider about your risks for falling. Tell your health care provider if: You fall. Be sure to tell your health care provider about all falls, even ones that seem minor. You feel dizzy, tiredness (fatigue), or off-balance. Take over-the-counter and prescription medicines only  as told by your health care provider. These include supplements. Eat a healthy diet and maintain a healthy weight. A healthy diet includes low-fat dairy products, low-fat (lean) meats, and fiber from whole grains, beans, and lots of fruits and vegetables. Stay current with your vaccines. Schedule regular health, dental, and eye exams. Summary Having a healthy lifestyle and getting preventive care can help to protect your health and wellness after age 20. Screening and testing are the best way to find a health problem early and help you avoid having a fall. Early diagnosis and treatment give you the best chance for managing medical conditions that are more common for people who are older than age 19. Falls are a major cause of broken bones and head injuries in people who are older than age 47. Take precautions to prevent a fall at home. Work with your health care provider to learn what changes you can make to improve your health and wellness and to prevent falls. This information is not intended to replace advice given to you by your health care provider. Make sure you discuss any questions you have with your health care provider. Document Revised: 08/24/2020 Document Reviewed: 08/24/2020 Elsevier Patient Education  [place].

## 2022-01-20 NOTE — Progress Notes (Signed)
Complete physical exam  Patient: Kenneth Duncan   DOB: [birth year]   66 y.o. Male  MRN: [id number]  Subjective:    Chief Complaint  Patient presents with   Hypertension    Pt [facility] . Also requesting blood work today .    Kenneth Duncan is a 66 y.o. male who presents today for a complete physical exam. He reports consuming a general diet. The patient has a physically strenuous job, but has no regular exercise apart from work.  He generally feels fairly well. He reports sleeping fairly well. He does not have additional problems to discuss today.    Most recent fall risk assessment:    01/20/2022    3:31 PM  [place] in the past year? 1  Number falls in past yr: 0  Injury with Fall? 1  Risk for fall due to : History of fall(s)  Follow up Falls evaluation completed     Most recent depression screenings:    01/20/2022    3:31 PM 10/21/2020   10:18 AM  PHQ 2/9 Scores  PHQ - 2 Score 0 0  PHQ- 9 Score 0 3    Vision:Within last year, Dental: No current dental problems and Receives regular dental care, and PSA: Agrees to PSA testing  Patient Active Problem List   Diagnosis Date Noted   Seasonal allergies 10/27/2020   Gastroesophageal reflux disease with esophagitis 10/27/2020   Moderate episode of recurrent major depressive disorder ([place]) 10/27/2020   Primary osteoarthritis of left knee 11/01/2019   Piriformis syndrome of both sides 10/09/2018   Lumbar herniated disc 05/23/2017   Lumbar foraminal stenosis 05/23/2017   Chronic SI joint pain 05/21/2017   Angio-edema 03/07/2017   History of colon polyps 11/01/2016   Elevated LDL cholesterol level 10/28/2015   Vitamin D deficiency 10/28/2015   Benign prostatic hyperplasia 10/21/2015   No energy 10/21/2015   OSA (obstructive sleep apnea) 08/24/2015   Hemorrhoid prolapse 08/15/2013   B12 deficiency 07/28/2012   Herpetiformis dermatitis 07/28/2012   Chronic  low back pain 06/14/2011   Degenerative disc disease, lumbar 06/14/2011   Essential hypertension 06/14/2011   GERD (gastroesophageal reflux disease) 06/14/2011   Anxiety disorder due to general medical condition 06/14/2011   Celiac disease 06/14/2011   Past Medical History:  Diagnosis Date   Celiac disease    Degenerative disc disease, lumbar    GERD (gastroesophageal reflux disease)    Hypertension    Family History  Problem Relation Age of Onset   Uterine cancer Mother    Heart attack Mother 65   Heart attack Father    Diabetes Father    Heart disease Father    Diabetes Sister    Heart disease Sister    Heart attack Sister    Lung cancer Brother    Lymphoma Brother    Uterine cancer Sister    Allergies  Allergen Reactions   Lisinopril Swelling    Angioedema limited to lips   Penicillins     Pt does not remember allergy- told as child      Patient Care Team: Lavada Mesi as PCP - General (Family Medicine) Dorene Ar, MD as Anesthesiologist (Pain Medicine)   Outpatient Medications Prior to Visit  Medication Sig   AMBULATORY NON FORMULARY MEDICATION CPAP pressure to be auto titration 5-20cm H20 and mask adjusted as needed. Diagnosis: OSA   AMBULATORY NON FORMULARY MEDICATION Syringes and  1" 25g needles for B12 injections   AMBULATORY NON FORMULARY MEDICATION Flex it Tens Unit to use as needed for muscle/back pain.   ibuprofen (ADVIL) 800 MG tablet Take 1 tablet (800 mg total) by mouth every 8 (eight) hours as needed.   [DISCONTINUED] atorvastatin (LIPITOR) 20 MG tablet Take 1 tablet (20 mg total) by mouth daily. APPT FOR REFILLS   [DISCONTINUED] cyanocobalamin (,VITAMIN B-12,) 1000 MCG/ML injection Inject 1 mL (1,000 mcg total) into the muscle every 30 (thirty) days.   [DISCONTINUED] dapsone 100 MG tablet Take 1 tablet (100 mg total) by mouth 2 (two) times daily. Appt for further refills   [DISCONTINUED] escitalopram (LEXAPRO) 20 MG tablet Take 1 tablet  (20 mg total) by mouth daily. Needs appt for refills   [DISCONTINUED] fluticasone (FLONASE) 50 MCG/ACT nasal spray SPRAY 2 SPRAYS INTO EACH NOSTRIL EVERY DAY   [DISCONTINUED] olmesartan (BENICAR) 40 MG tablet Take 1 tablet (40 mg total) by mouth daily. Needs appt   [DISCONTINUED] omeprazole (PRILOSEC) 20 MG capsule TAKE 1 CAPSULE DAILY (NEED APPOINTMENT FOR REFILLS)   [DISCONTINUED] triamcinolone cream (KENALOG) 0.1 % Apply 1 application topically 2 (two) times daily.   [DISCONTINUED] Vitamin D, Ergocalciferol, (DRISDOL) 1.25 MG (50000 UNIT) CAPS capsule Take 1 capsule (50,000 Units total) by mouth every 7 (seven) days.   No facility-administered medications prior to visit.    Review of Systems  All other systems reviewed and are negative.         Objective:     BP (!) 135/90   Pulse 66   Ht 6' 1"  (1.854 m)   Wt (!) 305 lb 0.6 oz (138.4 kg)   SpO2 95%   BMI 40.25 kg/m  BP Readings from Last 3 Encounters:  01/20/22 (!) 135/90  10/21/20 (!) 148/86  04/28/20 134/80   Wt Readings from Last 3 Encounters:  01/20/22 (!) 305 lb 0.6 oz (138.4 kg)  10/21/20 (!) 309 lb (140.2 kg)  12/27/18 (!) 301 lb (136.5 kg)      Physical Exam    BP (!) 135/90   Pulse 66   Ht 6' 1"  (1.854 m)   Wt (!) 305 lb 0.6 oz (138.4 kg)   SpO2 95%   BMI 40.25 kg/m   General Appearance:    Alert, cooperative, obese no distress, appears stated age  Head:    Normocephalic, without obvious abnormality, atraumatic  Eyes:    PERRL, conjunctiva/corneas clear, EOM's intact, fundi    benign, both eyes       Ears:    Normal TM's and external ear canals, both ears  Nose:   Nares normal, septum midline, mucosa normal, no drainage    or sinus tenderness  Throat:   Lips, mucosa, and tongue normal; poor dentition  Neck:   Supple, symmetrical, trachea midline, no adenopathy;       thyroid:  No enlargement/tenderness/nodules; no carotid   bruit or JVD  Back:     Symmetric, no curvature, ROM normal, no CVA  tenderness  Lungs:     Clear to auscultation bilaterally, respirations unlabored  Chest wall:    No tenderness or deformity  Heart:    Regular rate and rhythm, S1 and S2 normal, no murmur, rub   or gallop  Abdomen:     Soft, non-tender, bowel sounds active all four quadrants,    no masses, no organomegaly        Extremities:   Extremities normal, atraumatic, no cyanosis or edema  Pulses:   2+  and symmetric all extremities  Skin:   Skin color, texture, turgor normal, no rashes or lesions  Lymph nodes:   Cervical, supraclavicular, and axillary nodes normal  Neurologic:   CNII-XII intact. Normal strength, sensation and reflexes      throughout      Assessment & Plan:    Routine Health Maintenance and Physical Exam  Immunization History  Administered Date(s) Administered   Influenza-Unspecified 01/17/2014, 01/15/2022   PFIZER(Purple Top)SARS-COV-2 Vaccination 06/27/2019, 07/22/2019, 02/23/2020   PNEUMOCOCCAL CONJUGATE-20 10/21/2020   Pfizer Covid-19 Vaccine Bivalent Booster 1yr & up 04/03/2021   Tdap 07/08/2016   Zoster Recombinat (Shingrix) 10/21/2020   Zoster, Live 10/21/2015    Health Maintenance  Topic Date Due   Zoster Vaccines- Shingrix (2 of 2) 12/16/2020   COVID-19 Vaccine (5 - Pfizer risk series) 05/29/2021   COLONOSCOPY (Pts 45-48yrInsurance coverage will need to be confirmed)  10/17/2021   TETANUS/TDAP  07/09/2026   Pneumonia Vaccine 6568Years old  Completed   INFLUENZA VACCINE  Completed   Hepatitis C Screening  Completed   HPV VACCINES  Aged Out    Discussed health benefits of physical activity, and encouraged him to engage in regular exercise appropriate for his age and condition.  ..Marland KitchenPercell Milleras seen today for hypertension.  Diagnoses and all orders for this visit:  Routine physical examination  Screening for lipid disorders -     Lipid Panel w/reflex Direct LDL  B12 deficiency -     B12 and Folate Panel -     cyanocobalamin (VITAMIN B12) 1000  MCG/ML injection; Inject 1 mL (1,000 mcg total) into the muscle every 30 (thirty) days.  Vitamin D insufficiency -     Vitamin D (25 hydroxy) -     Vitamin D, Ergocalciferol, (DRISDOL) 1.25 MG (50000 UNIT) CAPS capsule; Take 1 capsule (50,000 Units total) by mouth every 7 (seven) days.  Prostate cancer screening -     PSA  Essential hypertension -     COMPLETE METABOLIC PANEL WITH GFR -     CBC with Differential/Platelet -     olmesartan (BENICAR) 40 MG tablet; Take 1 tablet (40 mg total) by mouth daily.  Screening for thyroid disorder -     TSH  Medication management -     Lipid Panel w/reflex Direct LDL -     TSH -     COMPLETE METABOLIC PANEL WITH GFR -     CBC with Differential/Platelet -     PSA -     B12 and Folate Panel -     Vitamin D (25 hydroxy)  Moderate episode of recurrent major depressive disorder (HCC) -     escitalopram (LEXAPRO) 20 MG tablet; Take 1 tablet (20 mg total) by mouth daily.  Gastroesophageal reflux disease with esophagitis -     omeprazole (PRILOSEC) 20 MG capsule; TAKE 1 CAPSULE DAILY -     cyanocobalamin (VITAMIN B12) 1000 MCG/ML injection; Inject 1 mL (1,000 mcg total) into the muscle every 30 (thirty) days.  Seasonal allergies -     fluticasone (FLONASE) 50 MCG/ACT nasal spray; SPRAY 2 SPRAYS INTO EACH NOSTRIL EVERY DAY  Celiac disease -     dapsone 100 MG tablet; Take 1 tablet (100 mg total) by mouth 2 (two) times daily. -     cyanocobalamin (VITAMIN B12) 1000 MCG/ML injection; Inject 1 mL (1,000 mcg total) into the muscle every 30 (thirty) days.  Screening for diabetes mellitus -     cyanocobalamin (VITAMIN B12)  1000 MCG/ML injection; Inject 1 mL (1,000 mcg total) into the muscle every 30 (thirty) days.  Elevated LDL cholesterol level -     atorvastatin (LIPITOR) 20 MG tablet; Take 1 tablet (20 mg total) by mouth daily.  Need for shingles vaccine -     Varicella-zoster vaccine IM  Other orders -     triamcinolone cream (KENALOG) 0.1  %; Apply 1 Application topically 2 (two) times daily.  Marland Kitchen.Start a regular exercise program and make sure you are eating a healthy diet Try to eat 4 servings of dairy a day or take a calcium supplement (571m twice a day). Your vaccines are up to date.  He will get Covid booster at pharmacy PHQ no concerns. Flu and shingles given today Fasting labs ordered Pt will call for colonoscopy Medications refilled today.   Return in about 1 year (around 01/21/2023).     JIran Planas PA-C

## 2022-02-01 DIAGNOSIS — M5136 Other intervertebral disc degeneration, lumbar region: Secondary | ICD-10-CM | POA: Diagnosis not present

## 2022-02-01 DIAGNOSIS — M4726 Other spondylosis with radiculopathy, lumbar region: Secondary | ICD-10-CM | POA: Diagnosis not present

## 2022-02-01 DIAGNOSIS — M5459 Other low back pain: Secondary | ICD-10-CM | POA: Diagnosis not present

## 2022-02-01 DIAGNOSIS — M48061 Spinal stenosis, lumbar region without neurogenic claudication: Secondary | ICD-10-CM | POA: Diagnosis not present

## 2022-02-28 ENCOUNTER — Telehealth: Payer: Self-pay

## 2022-02-28 DIAGNOSIS — U071 COVID-19: Secondary | ICD-10-CM

## 2022-02-28 MED ORDER — NIRMATRELVIR/RITONAVIR (PAXLOVID)TABLET: 3.0000 | ORAL_TABLET | Freq: Two times a day (BID) | ORAL | 0 refills | Status: AC

## 2022-02-28 NOTE — Telephone Encounter (Signed)
Contacted patient for an update of his symptoms since provider has not responded to request. Per patient, continues to have a fever (101.0), congestion, sinus headache, dizziness, cough, body/muscle aches since Sunday mid-day. Currently taking ibuprofen to help with symptoms. Patient was informed to add a decongestant to reduce cough and sinus symptoms. Patient advised that if symptoms should worsen or change to report to the nearest UC/ER for an evaluation. Patient was agreeable with the current plan of care. I have informed patient that I will give him a call to see how he is doing and to offer a virtual appointment if necessary. Will provider send in Paxlovid rx to pharmacy? Please advise, thanks.

## 2022-02-28 NOTE — Telephone Encounter (Signed)
Patient wife Kenneth Duncan called - states patient is positive with Covid ( 2 positive home tests)- she states he is not feeling well - cough, congestion  pressure in eyes and teeth- she is requesting a rx for paxlovid be sent in for him.

## 2022-03-01 ENCOUNTER — Telehealth: Payer: BC Managed Care – PPO | Admitting: Physician Assistant

## 2022-03-07 ENCOUNTER — Other Ambulatory Visit: Payer: Self-pay | Admitting: Physician Assistant

## 2022-03-07 DIAGNOSIS — K900 Celiac disease: Secondary | ICD-10-CM

## 2022-04-20 ENCOUNTER — Telehealth: Payer: Self-pay | Admitting: Neurology

## 2022-04-20 NOTE — Telephone Encounter (Signed)
Patient's wife called and LVM wanting to schedule a physical for patient. Please call patient's wife to schedule. [phone].

## 2022-05-06 DIAGNOSIS — Z09 Encounter for follow-up examination after completed treatment for conditions other than malignant neoplasm: Secondary | ICD-10-CM | POA: Diagnosis not present

## 2022-05-06 DIAGNOSIS — D122 Benign neoplasm of ascending colon: Secondary | ICD-10-CM | POA: Diagnosis not present

## 2022-05-06 DIAGNOSIS — D123 Benign neoplasm of transverse colon: Secondary | ICD-10-CM | POA: Diagnosis not present

## 2022-05-06 DIAGNOSIS — Z1211 Encounter for screening for malignant neoplasm of colon: Secondary | ICD-10-CM | POA: Diagnosis not present

## 2022-05-06 DIAGNOSIS — Z86010 Personal history of colonic polyps: Secondary | ICD-10-CM | POA: Diagnosis not present

## 2022-05-06 LAB — HM COLONOSCOPY

## 2022-06-07 DIAGNOSIS — M4726 Other spondylosis with radiculopathy, lumbar region: Secondary | ICD-10-CM | POA: Diagnosis not present

## 2022-06-07 DIAGNOSIS — M5136 Other intervertebral disc degeneration, lumbar region: Secondary | ICD-10-CM | POA: Diagnosis not present

## 2022-06-07 DIAGNOSIS — M48061 Spinal stenosis, lumbar region without neurogenic claudication: Secondary | ICD-10-CM | POA: Diagnosis not present

## 2022-06-17 ENCOUNTER — Encounter: Payer: Self-pay | Admitting: Physician Assistant

## 2022-07-10 ENCOUNTER — Ambulatory Visit
Admission: EM | Admit: 2022-07-10 | Discharge: 2022-07-10 | Disposition: A | Discharge status: Home / Self Care | Payer: BC Managed Care – PPO | Attending: Family Medicine | Admitting: Family Medicine

## 2022-07-10 ENCOUNTER — Other Ambulatory Visit: Payer: Self-pay

## 2022-07-10 DIAGNOSIS — J40 Bronchitis, not specified as acute or chronic: Secondary | ICD-10-CM

## 2022-07-10 DIAGNOSIS — R059 Cough, unspecified: Secondary | ICD-10-CM

## 2022-07-10 DIAGNOSIS — J309 Allergic rhinitis, unspecified: Secondary | ICD-10-CM

## 2022-07-10 MED ORDER — DOXYCYCLINE HYCLATE 100 MG PO CAPS: 100.0000 mg | ORAL_CAPSULE | Freq: Two times a day (BID) | ORAL | 0 refills | Status: AC

## 2022-07-10 MED ORDER — HYDROCODONE BIT-HOMATROP MBR 5-1.5 MG/5ML PO SOLN: 5.0000 mL | Freq: Four times a day (QID) | ORAL | 0 refills | Status: DC | PRN

## 2022-07-10 MED ORDER — PREDNISONE 10 MG (21) PO TBPK: ORAL_TABLET | Freq: Every day | ORAL | 0 refills | Status: DC

## 2022-07-10 MED ORDER — FEXOFENADINE HCL 180 MG PO TABS: 180.0000 mg | ORAL_TABLET | Freq: Every day | ORAL | 0 refills | Status: DC

## 2022-07-10 NOTE — Discharge Instructions (Addendum)
Instructed patient to take medications as directed with food to completion.  Advised patient to take Allegra and prednisone with first dose of Doxycycline for the next 10 days.  Advised may discontinue Allegra after 5 days and use as needed for concurrent postnasal drainage/drip.  Advised may use Hycodan daily, as needed for cough at night prior to sleep due to sedative effects of this medication.  Encouraged to increase daily water intake to 64 ounces per day while taking these medications.  Advised if symptoms worsen and/or unresolved please follow-up with PCP or here for further evaluation.

## 2022-07-10 NOTE — ED Triage Notes (Signed)
Pt states that he has a sore throat, cough, and chest congestion. X1 week

## 2022-07-10 NOTE — ED Provider Notes (Signed)
[facility]    CSN: [id number] Arrival date & time: 07/10/22  1113      History   Chief Complaint Chief Complaint  Patient presents with   Sore Throat    X1 week Sore throat, cough and chest congestion.     HPI Kenneth Duncan is a 67 y.o. male.   HPI Very PLEASANT 6-YEAR-OLD MALE PRESENTS WITH SORE THROAT, persistent COUGH, AND CHEST CONGESTION FOR 1 WEEK.  Patient reports cough is worse at night and currently disrupting sleep PMH significant for morbid obesity, HTN, and chronic low back pain.  Past Medical History:  Diagnosis Date   Celiac disease    Degenerative disc disease, lumbar    GERD (gastroesophageal reflux disease)    Hypertension     Patient Active Problem List   Diagnosis Date Noted   Seasonal allergies 10/27/2020   Gastroesophageal reflux disease with esophagitis 10/27/2020   Moderate episode of recurrent major depressive disorder ([place]) 10/27/2020   Primary osteoarthritis of left knee 11/01/2019   Piriformis syndrome of both sides 10/09/2018   Lumbar herniated disc 05/23/2017   Lumbar foraminal stenosis 05/23/2017   Chronic SI joint pain 05/21/2017   Angio-edema 03/07/2017   History of colon polyps 11/01/2016   Elevated LDL cholesterol level 10/28/2015   Vitamin D deficiency 10/28/2015   Benign prostatic hyperplasia 10/21/2015   No energy 10/21/2015   OSA (obstructive sleep apnea) 08/24/2015   Hemorrhoid prolapse 08/15/2013   B12 deficiency 07/28/2012   Herpetiformis dermatitis 07/28/2012   Chronic low back pain 06/14/2011   Degenerative disc disease, lumbar 06/14/2011   Essential hypertension 06/14/2011   GERD (gastroesophageal reflux disease) 06/14/2011   Anxiety disorder due to general medical condition 06/14/2011   Celiac disease 06/14/2011    Past Surgical History:  Procedure Laterality Date   ANTERIOR CERVICAL DECOMP/DISCECTOMY FUSION     APPENDECTOMY     clavicle     distal end removed.   COLONOSCOPY W/ BIOPSIES   10/17/2016   knee arthoscopic surgery      Bilateral menicus tear       Home Medications    Prior to Admission medications   Medication Sig Start Date End Date Taking? Authorizing Provider  AMBULATORY NON FORMULARY MEDICATION CPAP pressure to be auto titration 5-20cm H20 and mask adjusted as needed. Diagnosis: OSA 11/05/15  Yes Breeback, Jade L, PA-C  AMBULATORY NON FORMULARY MEDICATION Syringes and 1" 25g needles for B12 injections 05/18/18  Yes Breeback, Jade L, PA-C  AMBULATORY NON FORMULARY MEDICATION Flex it Tens Unit to use as needed for muscle/back pain. 10/02/18  Yes Breeback, Jade L, PA-C  atorvastatin (LIPITOR) 20 MG tablet Take 1 tablet (20 mg total) by mouth daily. 01/20/22  Yes Breeback, Jade L, PA-C  cyanocobalamin (VITAMIN B12) 1000 MCG/ML injection Inject 1 mL (1,000 mcg total) into the muscle every 30 (thirty) days. 01/20/22  Yes Breeback, Jade L, PA-C  dapsone 100 MG tablet Take 1 tablet (100 mg total) by mouth 2 (two) times daily. 01/20/22  Yes Breeback, Jade L, PA-C  doxycycline (VIBRAMYCIN) 100 MG capsule Take 1 capsule (100 mg total) by mouth 2 (two) times daily for 10 days. 07/10/22 07/20/22 Yes Eliezer Lofts, FNP  escitalopram (LEXAPRO) 20 MG tablet Take 1 tablet (20 mg total) by mouth daily. 01/20/22  Yes Breeback, Jade L, PA-C  fexofenadine (ALLEGRA ALLERGY) 180 MG tablet Take 1 tablet (180 mg total) by mouth daily for 15 days. 07/10/22 07/25/22 Yes Eliezer Lofts, FNP  fluticasone (FLONASE) 50  MCG/ACT nasal spray SPRAY 2 SPRAYS INTO EACH NOSTRIL EVERY DAY 01/20/22  Yes Breeback, Jade L, PA-C  HYDROcodone bit-homatropine (HYCODAN) 5-1.5 MG/5ML syrup Take 5 mLs by mouth every 6 (six) hours as needed for cough. 07/10/22  Yes Eliezer Lofts, FNP  ibuprofen (ADVIL) 800 MG tablet Take 1 tablet (800 mg total) by mouth every 8 (eight) hours as needed. 08/31/20  Yes Breeback, Jade L, PA-C  olmesartan (BENICAR) 40 MG tablet Take 1 tablet (40 mg total) by mouth daily. 01/20/22  Yes Breeback,  Jade L, PA-C  omeprazole (PRILOSEC) 20 MG capsule TAKE 1 CAPSULE DAILY 01/20/22  Yes Breeback, Jade L, PA-C  predniSONE (STERAPRED UNI-PAK 21 TAB) 10 MG (21) TBPK tablet Take by mouth daily. Take 6 tabs by mouth daily  for 2 days, then 5 tabs for 2 days, then 4 tabs for 2 days, then 3 tabs for 2 days, 2 tabs for 2 days, then 1 tab by mouth daily for 2 days 07/10/22  Yes Eliezer Lofts, FNP  triamcinolone cream (KENALOG) 0.1 % Apply 1 Application topically 2 (two) times daily. 01/20/22  Yes Breeback, Jade L, PA-C  Vitamin D, Ergocalciferol, (DRISDOL) 1.25 MG (50000 UNIT) CAPS capsule Take 1 capsule (50,000 Units total) by mouth every 7 (seven) days. 01/20/22  Yes Donella Stade, PA-C    Family History Family History  Problem Relation Age of Onset   Uterine cancer Mother    Heart attack Mother 75   Heart attack Father    Diabetes Father    Heart disease Father    Diabetes Sister    Heart disease Sister    Heart attack Sister    Lung cancer Brother    Lymphoma Brother    Uterine cancer Sister     Social History Social History   Tobacco Use   Smoking status: Never   Smokeless tobacco: Never  Substance Use Topics   Alcohol use: No   Drug use: No     Allergies   Lisinopril and Penicillins   Review of Systems Review of Systems  HENT:  Positive for congestion.   Respiratory:  Positive for cough and wheezing.   All other systems reviewed and are negative.    Physical Exam Triage Vital Signs ED Triage Vitals  Enc Vitals Group     BP      Pulse      Resp      Temp      Temp src      SpO2      Weight      Height      Head Circumference      Peak Flow      Pain Score      Pain Loc      Pain Edu?      Excl. in [place]?    No data found.  Updated Vital Signs BP 133/76 (BP Location: Left Arm)   Pulse 69   Temp 97.9 F (36.6 C) (Oral)   Resp 18   Ht 6\' 2"  (1.88 m)   Wt 300 lb (136.1 kg)   SpO2 100%   BMI 38.52 kg/m      Physical Exam Vitals and nursing note  reviewed.  Constitutional:      Appearance: Normal appearance. He is obese.  HENT:     Head: Normocephalic and atraumatic.     Right Ear: External ear normal.     Left Ear: External ear normal.     Ears:  Comments: TM's are dull, with decreased mobility; moderate eustachian tube dysfunction noted bilaterally    Mouth/Throat:     Mouth: Mucous membranes are moist.     Pharynx: Oropharynx is clear.     Comments: Significant amount of clear drainage of posterior oropharynx noted Eyes:     Extraocular Movements: Extraocular movements intact.     Conjunctiva/sclera: Conjunctivae normal.     Pupils: Pupils are equal, round, and reactive to light.  Cardiovascular:     Rate and Rhythm: Normal rate and regular rhythm.     Pulses: Normal pulses.     Heart sounds: Normal heart sounds. No murmur heard. Pulmonary:     Effort: Pulmonary effort is normal.     Breath sounds: Rhonchi present. No wheezing or rales.     Comments: Mild diffuse scattered rhonchi throughout with infrequent nonproductive cough noted Musculoskeletal:        General: Normal range of motion.     Cervical back: Normal range of motion and neck supple.  Skin:    General: Skin is warm and dry.  Neurological:     General: No focal deficit present.     Mental Status: He is oriented to person, place, and time. Mental status is at baseline.      UC Treatments / Results  Labs (all labs ordered are listed, but only abnormal results are displayed) Labs Reviewed - No data to display  EKG   Radiology No results found.  Procedures Procedures (including critical care time)  Medications Ordered in UC Medications - No data to display  Initial Impression / Assessment and Plan / UC Course  I have reviewed the triage vital signs and the nursing notes.  Pertinent labs & imaging results that were available during my care of the patient were reviewed by me and considered in my medical decision making (see chart for  details).     MDM: 1.  Cough, unspecified type-Rx'd Doxycycline 100 mg daily x 10 days, Hycodan 5/1.5 mg / 5 mL syrup-5 mL every 6 hours for cough at night prior to sleep; 2.  Bronchitis-Rx'd Sterapred Unip[place] (tapering from 60 mg to 10 mg over 10 days); 3.  Allergic rhinitis, unspecified seasonality, unspecified trigger-Rx'd Allegra 180 mg daily x 5 days, then as needed. Instructed patient to take medications as directed with food to completion.  Advised patient to take Allegra and prednisone with first dose of Doxycycline for the next 10 days.  Advised may discontinue Allegra after 5 days and use as needed for concurrent postnasal drainage/drip.  Advised may use Hycodan daily, as needed for cough at night prior to sleep due to sedative effects of this medication.  Encouraged to increase daily water intake to 64 ounces per day while taking these medications.  Advised if symptoms worsen and/or unresolved please follow-up with PCP or here for further evaluation.  Work note provided to patient prior to discharge today.  Patient discharged home, hemodynamically stable. Final Clinical Impressions(s) / UC Diagnoses   Final diagnoses:  Bronchitis  Cough, unspecified type  Allergic rhinitis, unspecified seasonality, unspecified trigger     Discharge Instructions      Instructed patient to take medications as directed with food to completion.  Advised patient to take Allegra and prednisone with first dose of Doxycycline for the next 10 days.  Advised may discontinue Allegra after 5 days and use as needed for concurrent postnasal drainage/drip.  Advised may use Hycodan daily, as needed for cough at night prior to sleep  due to sedative effects of this medication.  Encouraged to increase daily water intake to 64 ounces per day while taking these medications.  Advised if symptoms worsen and/or unresolved please follow-up with PCP or here for further evaluation.     ED Prescriptions     Medication Sig  Dispense Auth. Provider   doxycycline (VIBRAMYCIN) 100 MG capsule Take 1 capsule (100 mg total) by mouth 2 (two) times daily for 10 days. 20 capsule Eliezer Lofts, FNP   predniSONE (STERAPRED UNI-PAK 21 TAB) 10 MG (21) TBPK tablet Take by mouth daily. Take 6 tabs by mouth daily  for 2 days, then 5 tabs for 2 days, then 4 tabs for 2 days, then 3 tabs for 2 days, 2 tabs for 2 days, then 1 tab by mouth daily for 2 days 42 tablet Eliezer Lofts, FNP   fexofenadine [facility] ALLERGY) 180 MG tablet Take 1 tablet (180 mg total) by mouth daily for 15 days. 15 tablet Eliezer Lofts, FNP   HYDROcodone bit-homatropine (HYCODAN) 5-1.5 MG/5ML syrup Take 5 mLs by mouth every 6 (six) hours as needed for cough. 120 mL Eliezer Lofts, FNP      PDMP not reviewed this encounter.   Eliezer Lofts, [place] 07/10/22 1214

## 2022-07-11 ENCOUNTER — Telehealth: Payer: Self-pay | Admitting: Emergency Medicine

## 2022-07-11 NOTE — Telephone Encounter (Signed)
Spoke with patient, states that he is feeling better since starting the medication.  Patient does feel that he is turning the corner.  Will follow up as needed.

## 2022-07-27 ENCOUNTER — Telehealth: Payer: Self-pay

## 2022-07-27 NOTE — Telephone Encounter (Signed)
Patients wife came by office to drop off [employer] forms to be completed by [facility], forms placed in [place] box for completion, thanks.

## 2022-08-01 ENCOUNTER — Encounter: Payer: Self-pay | Admitting: Physician Assistant

## 2022-08-01 ENCOUNTER — Ambulatory Visit: Payer: BC Managed Care – PPO | Admitting: Physician Assistant

## 2022-08-01 VITALS — BP 167/84 | HR 79 | Ht 73.0 in | Wt 303.0 lb

## 2022-08-01 DIAGNOSIS — I10 Essential (primary) hypertension: Secondary | ICD-10-CM | POA: Diagnosis not present

## 2022-08-01 DIAGNOSIS — J329 Chronic sinusitis, unspecified: Secondary | ICD-10-CM

## 2022-08-01 DIAGNOSIS — J40 Bronchitis, not specified as acute or chronic: Secondary | ICD-10-CM | POA: Diagnosis not present

## 2022-08-01 MED ORDER — HYDROCHLOROTHIAZIDE 12.5 MG PO TABS: 12.5000 mg | ORAL_TABLET | Freq: Every day | ORAL | 2 refills | Status: DC

## 2022-08-01 NOTE — Patient Instructions (Signed)
Added HCTZ daily.

## 2022-08-01 NOTE — Progress Notes (Signed)
Acute Office Visit  Subjective:     Patient ID: Kenneth Duncan, male    DOB: February 12, 1956, 67 y.o.   MRN: [id number]  Chief Complaint  Patient presents with   Follow-up    Pt was seen at urgent care    HPI Patient is in today for to follow up on sinobronchitis and time off work. He started having URI symptoms on 3/15. His first day off work was 3/19. He went to UC on 3/24 and given doxycycline, hycodan, allegra and prednisone. He went back to work on 3/27. He is feeling much better. He completed all medications.   He has noticed some dizziness when standing and BP in the 140s over 90s. No CP or palpitations. He is taking his benicar.   .. Active Ambulatory Problems    Diagnosis Date Noted   Chronic low back pain 06/14/2011   Degenerative disc disease, lumbar 06/14/2011   Essential hypertension 06/14/2011   GERD (gastroesophageal reflux disease) 06/14/2011   Anxiety disorder due to general medical condition 06/14/2011   Celiac disease 06/14/2011   B12 deficiency 07/28/2012   Herpetiformis dermatitis 07/28/2012   Hemorrhoid prolapse 08/15/2013   OSA (obstructive sleep apnea) 08/24/2015   Benign prostatic hyperplasia 10/21/2015   No energy 10/21/2015   Elevated LDL cholesterol level 10/28/2015   Vitamin D deficiency 10/28/2015   History of colon polyps 11/01/2016   Angio-edema 03/07/2017   Chronic SI joint pain 05/21/2017   Lumbar herniated disc 05/23/2017   Lumbar foraminal stenosis 05/23/2017   Piriformis syndrome of both sides 10/09/2018   Primary osteoarthritis of left knee 11/01/2019   Seasonal allergies 10/27/2020   Gastroesophageal reflux disease with esophagitis 10/27/2020   Moderate episode of recurrent major depressive disorder 10/27/2020   Resolved Ambulatory Problems    Diagnosis Date Noted   No Resolved Ambulatory Problems   Past Medical History:  Diagnosis Date   Hypertension        ROS  See HPI.     Objective:    BP (!) 167/84   Pulse 79    Ht  (1.854 m)   Wt (!) 303 lb (137.4 kg)   SpO2 94%   BMI 39.98 kg/m  BP Readings from Last 3 Encounters:  08/01/22 (!) 167/84  07/10/22 133/76  01/20/22 (!) 135/90   Wt Readings from Last 3 Encounters:  08/01/22 (!) 303 lb (137.4 kg)  07/10/22 300 lb (136.1 kg)  01/20/22 (!) 305 lb 4 oz (138.5 kg)      Physical Exam Constitutional:      Appearance: Normal appearance. He is obese.  HENT:     Head: Normocephalic.     Nose: Nose normal.     Mouth/Throat:     Mouth: Mucous membranes are moist.  Eyes:     Conjunctiva/sclera: Conjunctivae normal.  Neck:     Vascular: No carotid bruit.  Cardiovascular:     Rate and Rhythm: Normal rate and regular rhythm.     Pulses: Normal pulses.     Heart sounds: Normal heart sounds.  Pulmonary:     Effort: Pulmonary effort is normal.     Breath sounds: Normal breath sounds.  Musculoskeletal:     Cervical back: Normal range of motion and neck supple. No rigidity or tenderness.     Right lower leg: No edema.     Left lower leg: No edema.  Lymphadenopathy:     Cervical: No cervical adenopathy.  Neurological:     General: No focal deficit  present.     Mental Status: He is alert and oriented to person, place, and time.  Psychiatric:        Mood and Affect: Mood normal.          Assessment & Plan:  Marland KitchenMarland KitchenEsiah was seen today for follow-up.  Diagnoses and all orders for this visit:  Sinobronchitis  Essential hypertension -     hydrochlorothiazide (HYDRODIURIL) 12.5 MG tablet; Take 1 tablet (12.5 mg total) by mouth daily.   Sinobronchitis resolved and patient is off all medication, no concerns FMLA paperwork filled out from 3/19 through 3/26.  BP not to goal Added HcTZ 12.5mg  to benicar 40mg  Follow up in 2 weeks for nurse visit   Tandy Gaw, PA-C

## 2022-08-13 ENCOUNTER — Other Ambulatory Visit: Payer: Self-pay | Admitting: Physician Assistant

## 2022-08-13 DIAGNOSIS — I10 Essential (primary) hypertension: Secondary | ICD-10-CM

## 2022-08-16 ENCOUNTER — Ambulatory Visit: Payer: BC Managed Care – PPO

## 2022-08-19 ENCOUNTER — Ambulatory Visit (INDEPENDENT_AMBULATORY_CARE_PROVIDER_SITE_OTHER): Payer: BC Managed Care – PPO | Admitting: Physician Assistant

## 2022-08-19 VITALS — BP 133/83 | HR 73

## 2022-08-19 DIAGNOSIS — I10 Essential (primary) hypertension: Secondary | ICD-10-CM | POA: Diagnosis not present

## 2022-08-19 NOTE — Progress Notes (Signed)
Improved BP. Continue same medications. Follow up in 3 months.

## 2022-08-19 NOTE — Progress Notes (Signed)
   Established Patient Office Visit  Subjective   Patient ID: Kenneth Duncan, male    DOB: 1956-03-06  Age: 67 y.o. MRN: [id number]  Chief Complaint  Patient presents with   Hypertension    HPI  Kenneth Duncan is here for blood pressure check. Denies chest pain, shortness of breath or dizziness.   ROS    Objective:     BP 133/83   Pulse 73   SpO2 95%    Physical Exam   No results found for any visits on 08/19/22.    The 10-year ASCVD risk score (Arnett DK, et al., 2019) is: 15.5%    Assessment & Plan:  Hypertension - Blood pressure within normal limits. Patient advised to continue medications as directed. Follow up in 3 months with [facility] for HTN.  Problem List Items Addressed This Visit       Unprioritized   Essential hypertension - Primary    Return in about 3 months (around 11/19/2022) for HTN with Jade. Earna Coder, Janalyn Harder, CMA

## 2022-08-30 ENCOUNTER — Telehealth: Payer: Self-pay | Admitting: Physician Assistant

## 2022-08-30 NOTE — Telephone Encounter (Signed)
Patient's wife called about husband he is taking hydrocholorothiazide 12.5mg  when he stands up he feels like he is going to pass out and daughter Leotis Shames has diagnosed with recently and thinks her dad my have it please advise nadolol is what she is concerned about

## 2022-09-06 ENCOUNTER — Telehealth: Payer: Self-pay | Admitting: Physician Assistant

## 2022-09-06 NOTE — Telephone Encounter (Signed)
Kenneth Duncan called. Pt was charged for an office visit when the pt only saw the nurse. She is asking for code to be corrected.

## 2022-09-07 NOTE — Telephone Encounter (Signed)
Pt's wife called. Pt wants to continue taking the HCTZ.  He has switched to taking the med at night when he goes to bed and this is working for him. He feels better and he is not getting up at night.

## 2022-09-07 NOTE — Telephone Encounter (Signed)
I explained this was a nurse/level 1 charge. She states he was still billed a co-pay. Some insurances will charge a co-pay. She understood.

## 2022-10-18 ENCOUNTER — Other Ambulatory Visit: Payer: Self-pay | Admitting: Physician Assistant

## 2022-10-18 DIAGNOSIS — E559 Vitamin D deficiency, unspecified: Secondary | ICD-10-CM

## 2022-10-18 DIAGNOSIS — K900 Celiac disease: Secondary | ICD-10-CM

## 2022-10-18 DIAGNOSIS — I10 Essential (primary) hypertension: Secondary | ICD-10-CM

## 2022-11-08 ENCOUNTER — Telehealth: Payer: Self-pay | Admitting: Physician Assistant

## 2022-11-08 MED ORDER — OMEPRAZOLE 40 MG PO CPDR: 40.0000 mg | DELAYED_RELEASE_CAPSULE | Freq: Every day | ORAL | 3 refills | Status: DC

## 2022-11-08 NOTE — Telephone Encounter (Signed)
Patient's wife called states patient is requesting an increase on his omeprazole 20mg  to 40 mg per day his heartburn is worse

## 2022-11-09 NOTE — Telephone Encounter (Signed)
Attempted call to patient. Left a voice mail message  to return our call.

## 2022-11-10 NOTE — Telephone Encounter (Signed)
Attempted call to patient. Left voice mail message requesting a return call.  

## 2022-11-15 NOTE — Telephone Encounter (Signed)
Attempted call to patient. Left voice mail message requesting return call.

## 2022-11-15 NOTE — Telephone Encounter (Signed)
Letter placed in mail to patient.  

## 2022-11-24 ENCOUNTER — Other Ambulatory Visit: Payer: Self-pay | Admitting: Physician Assistant

## 2022-11-24 DIAGNOSIS — K900 Celiac disease: Secondary | ICD-10-CM

## 2022-11-28 ENCOUNTER — Ambulatory Visit: Payer: BC Managed Care – PPO | Admitting: Physician Assistant

## 2022-11-28 VITALS — BP 131/70 | Wt 303.0 lb

## 2022-11-28 DIAGNOSIS — Z13220 Encounter for screening for lipoid disorders: Secondary | ICD-10-CM | POA: Diagnosis not present

## 2022-11-28 DIAGNOSIS — Z131 Encounter for screening for diabetes mellitus: Secondary | ICD-10-CM

## 2022-11-28 DIAGNOSIS — F331 Major depressive disorder, recurrent, moderate: Secondary | ICD-10-CM

## 2022-11-28 DIAGNOSIS — Z6841 Body Mass Index (BMI) 40.0 and over, adult: Secondary | ICD-10-CM

## 2022-11-28 DIAGNOSIS — K2100 Gastro-esophageal reflux disease with esophagitis, without bleeding: Secondary | ICD-10-CM

## 2022-11-28 DIAGNOSIS — E7800 Pure hypercholesterolemia, unspecified: Secondary | ICD-10-CM

## 2022-11-28 DIAGNOSIS — Z79899 Other long term (current) drug therapy: Secondary | ICD-10-CM

## 2022-11-28 DIAGNOSIS — M1712 Unilateral primary osteoarthritis, left knee: Secondary | ICD-10-CM

## 2022-11-28 DIAGNOSIS — I10 Essential (primary) hypertension: Secondary | ICD-10-CM

## 2022-11-28 DIAGNOSIS — G4733 Obstructive sleep apnea (adult) (pediatric): Secondary | ICD-10-CM

## 2022-11-28 DIAGNOSIS — K900 Celiac disease: Secondary | ICD-10-CM

## 2022-11-28 MED ORDER — ZEPBOUND 5 MG/0.5ML ~~LOC~~ SOAJ: 5.0000 mg | SUBCUTANEOUS | 1 refills | Status: DC

## 2022-11-28 MED ORDER — ZEPBOUND 2.5 MG/0.5ML ~~LOC~~ SOAJ: 2.5000 mg | SUBCUTANEOUS | 0 refills | Status: DC

## 2022-11-28 MED ORDER — HYDROCHLOROTHIAZIDE 12.5 MG PO TABS: 12.5000 mg | ORAL_TABLET | Freq: Every day | ORAL | 1 refills | Status: DC

## 2022-11-28 MED ORDER — ESCITALOPRAM OXALATE 20 MG PO TABS: 20.0000 mg | ORAL_TABLET | Freq: Every day | ORAL | 3 refills | Status: DC

## 2022-11-28 MED ORDER — IBUPROFEN 800 MG PO TABS: 800.0000 mg | ORAL_TABLET | Freq: Three times a day (TID) | ORAL | 3 refills | Status: DC | PRN

## 2022-11-28 MED ORDER — OLMESARTAN MEDOXOMIL 40 MG PO TABS: 40.0000 mg | ORAL_TABLET | Freq: Every day | ORAL | 3 refills | Status: DC

## 2022-11-28 MED ORDER — DAPSONE 100 MG PO TABS: 100.0000 mg | ORAL_TABLET | Freq: Two times a day (BID) | ORAL | 3 refills | Status: DC

## 2022-11-28 NOTE — Progress Notes (Unsigned)
Established Patient Office Visit  Subjective   Patient ID: Kenneth Duncan, male    DOB: [birth year]  Age: 67 y.o. MRN: [id number]  Chief Complaint  Patient presents with   Medical Management of Chronic Issues    B/p check    HPI Pt is a 67 yo obese male with HTN, celiac disease, elevated LDL, seasonal allergies, left knee pain due to OA, OSA, chronic low back pain due to lumbar DDD who presents to the clinic for follow up and medication mangement.   Pt sees pain management for lumbar DDD and gets injections. His left knee is really bothering him more and more. He would like referral to orthopedics. He has not tried injections. Ibuprofen helps a lot with knee pain.   He denies any CP, palpitations, headaches, vision changes. He is compliant with medications. He does not exercise.   He does want to lose weight and wife is doing well on zepbound and would like to try it. He is using his CPAP nightly.   Mood is good. No concerns.     ,.. Active Ambulatory Problems    Diagnosis Date Noted   Chronic low back pain 06/14/2011   Degenerative disc disease, lumbar 06/14/2011   Essential hypertension 06/14/2011   GERD (gastroesophageal reflux disease) 06/14/2011   Anxiety disorder due to general medical condition 06/14/2011   Celiac disease 06/14/2011   B12 deficiency 07/28/2012   Herpetiformis dermatitis 07/28/2012   Hemorrhoid prolapse 08/15/2013   OSA (obstructive sleep apnea) 08/24/2015   Benign prostatic hyperplasia 10/21/2015   No energy 10/21/2015   Elevated LDL cholesterol level 10/28/2015   Vitamin D deficiency 10/28/2015   History of colon polyps 11/01/2016   Angio-edema 03/07/2017   Chronic SI joint pain 05/21/2017   Lumbar herniated disc 05/23/2017   Lumbar foraminal stenosis 05/23/2017   Piriformis syndrome of both sides 10/09/2018   Primary osteoarthritis of left knee 11/01/2019   Seasonal allergies 10/27/2020   Gastroesophageal reflux disease with esophagitis  10/27/2020   Moderate episode of recurrent major depressive disorder (HCC) 10/27/2020   Class 3 severe obesity due to excess calories without serious comorbidity with body mass index (BMI) of 40.0 to 44.9 in adult [facility]) 11/29/2022   Resolved Ambulatory Problems    Diagnosis Date Noted   No Resolved Ambulatory Problems   Past Medical History:  Diagnosis Date   Hypertension      Review of Systems  All other systems reviewed and are negative.     Objective:     BP 131/70   Wt (!) 303 lb (137.4 kg)   BMI 39.98 kg/m  BP Readings from Last 3 Encounters:  11/28/22 131/70  08/19/22 133/83  08/01/22 (!) 167/84   Wt Readings from Last 3 Encounters:  11/28/22 (!) 303 lb (137.4 kg)  08/01/22 (!) 303 lb (137.4 kg)  07/10/22 300 lb (136.1 kg)    ..    11/29/2022    6:27 AM 01/20/2022    3:31 PM 10/21/2020   10:18 AM 05/17/2017    2:14 PM 07/08/2016    4:04 PM  Depression screen PHQ 2/9  Decreased Interest 0 0 0 0 0  Down, Depressed, Hopeless 0 0 0 0 0  PHQ - 2 Score 0 0 0 0 0  Altered sleeping 0 0 2    Tired, decreased energy 0 0 1    Change in appetite 0 0 0    Feeling bad or failure about yourself  0 0 0  Trouble concentrating 0 0 0    Moving slowly or fidgety/restless 0 0 0    Suicidal thoughts 0 0 0    PHQ-9 Score 0 0 3    Difficult doing work/chores Not difficult at all Not difficult at all Not difficult at all       Physical Exam Constitutional:      Appearance: Normal appearance. He is obese.  HENT:     Head: Normocephalic.  Neck:     Vascular: No carotid bruit.  Cardiovascular:     Rate and Rhythm: Normal rate and regular rhythm.     Pulses: Normal pulses.  Pulmonary:     Effort: Pulmonary effort is normal.     Breath sounds: Normal breath sounds.  Musculoskeletal:     Cervical back: Normal range of motion and neck supple.     Right lower leg: No edema.     Left lower leg: No edema.  Neurological:     General: No focal deficit present.     Mental  Status: He is alert and oriented to person, place, and time.  Psychiatric:        Mood and Affect: Mood normal.       The 10-year ASCVD risk score (Arnett DK, et al., 2019) is: 15.1%    Assessment & Plan:  Marland KitchenMarland KitchenQuamel was seen today for medical management of chronic issues.  Diagnoses and all orders for this visit:  Essential hypertension -     Lipid panel -     CMP14+EGFR -     TSH -     CBC w/Diff/Platelet -     tirzepatide (ZEPBOUND) 5 MG/0.5ML Pen; Inject 5 mg into the skin once a week. -     tirzepatide (ZEPBOUND) 2.5 MG/0.5ML Pen; Inject 2.5 mg into the skin once a week. -     olmesartan (BENICAR) 40 MG tablet; Take 1 tablet (40 mg total) by mouth daily. -     hydrochlorothiazide (HYDRODIURIL) 12.5 MG tablet; Take 1 tablet (12.5 mg total) by mouth daily.  Moderate episode of recurrent major depressive disorder (HCC) -     escitalopram (LEXAPRO) 20 MG tablet; Take 1 tablet (20 mg total) by mouth daily.  Screening for diabetes mellitus -     CMP14+EGFR  Screening for lipid disorders -     Lipid panel  Celiac disease -     dapsone 100 MG tablet; Take 1 tablet (100 mg total) by mouth 2 (two) times daily. TAKE 1 TABLET (100 MG TOTAL) BY MOUTH 2 (TWO) TIMES DAILY.  Medication management  Gastroesophageal reflux disease with esophagitis without hemorrhage  Elevated LDL cholesterol level -     tirzepatide (ZEPBOUND) 5 MG/0.5ML Pen; Inject 5 mg into the skin once a week. -     tirzepatide (ZEPBOUND) 2.5 MG/0.5ML Pen; Inject 2.5 mg into the skin once a week.  Class 3 severe obesity due to excess calories without serious comorbidity with body mass index (BMI) of 40.0 to 44.9 in adult (HCC) -     tirzepatide (ZEPBOUND) 5 MG/0.5ML Pen; Inject 5 mg into the skin once a week. -     tirzepatide (ZEPBOUND) 2.5 MG/0.5ML Pen; Inject 2.5 mg into the skin once a week.  Primary localized osteoarthritis of left knee -     ibuprofen (ADVIL) 800 MG tablet; Take 1 tablet (800 mg total) by  mouth every 8 (eight) hours as needed. -     tirzepatide (ZEPBOUND) 5 MG/0.5ML Pen; Inject 5 mg into  the skin once a week. -     tirzepatide (ZEPBOUND) 2.5 MG/0.5ML Pen; Inject 2.5 mg into the skin once a week. -     Ambulatory referral to Orthopedic Surgery  OSA (obstructive sleep apnea) -     tirzepatide (ZEPBOUND) 5 MG/0.5ML Pen; Inject 5 mg into the skin once a week. -     tirzepatide (ZEPBOUND) 2.5 MG/0.5ML Pen; Inject 2.5 mg into the skin once a week.   bP to goal Refills sent Needs fasting labs that were printed and patient states he will come back fasting Referral to ortho for knee OA Ibuprofen refilled PHQ no concerns, lexapro refilled Continue using CPAP. Marland Kitchen.Discussed low carb diet with 1500 calories and 80g of protein.  Exercising at least 150 minutes a week.  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  BMI 40 with multiple co-morbities Start zepbound and titrate up Discussed side effects Follow up in 3 months   Return in about 3 months (around 02/28/2023).    Tandy Gaw, PA-C

## 2022-11-28 NOTE — Patient Instructions (Signed)
Tirzepatide Injection (Weight Management) What is this medication? TIRZEPATIDE (tir ZEP a tide) promotes weight loss. It may also be used to maintain weight loss.  It works by decreasing appetite. Changes to diet and exercise are often combined with this medication. This medicine may be used for other purposes; ask your health care provider or pharmacist if you have questions. COMMON BRAND NAME(S): Zepbound What should I tell my care team before I take this medication? They need to know if you have any of these conditions: Eye disease caused by diabetes Gallbladder disease History of depression Pancreatic disease Kidney disease Stomach or intestine problems, such as problems digesting food Suicidal thoughts, plans, or attempt by you or a family member Personal or family history of MEN 2, a condition that causes endocrine gland tumors Personal or family history of thyroid cancer An unusual or allergic reaction to tirzepatide, other medications, foods, dyes, or preservatives Pregnant or trying to get pregnant Breastfeeding How should I use this medication? This medication is injected under the skin. You will be taught how to prepare and give it. Take it as directed on the prescription label. Keep taking it unless your care team tells you to stop. It is important that you put your used needles and syringes in a special sharps container. Do not put them in a trash can. If you do not have a sharps container, call your pharmacist or care team to get one. A special MedGuide will be given to you by the pharmacist with each prescription and refill. Be sure to read this information carefully each time. This medication comes with INSTRUCTIONS FOR USE. Ask your pharmacist for directions on how to use this medication. Read the information carefully. Talk to your pharmacist or care team if you have questions. Talk to your care team about the use of this medication in children. Special care may be  needed. Overdosage: If you think you have taken too much of this medicine contact a poison control center or emergency room at once. NOTE: This medicine is only for you. Do not share this medicine with others. What if I miss a dose? If you miss a dose, take it as soon as you can unless it is more than 4 days (96 hours) late. If it is more than 4 days late, skip the missed dose. Take the next dose at the normal time. Do not take 2 doses within 3 days (72 hours) of each other. What may interact with this medication? Certain medications for diabetes, such as insulin, glyburide, glipizide This medication may affect how other medications work. Talk with your care team about all of the medications you take. They may suggest changes to your treatment plan to lower the risk of side effects and to make sure your medications work as intended. This list may not describe all possible interactions. Give your health care provider a list of all the medicines, herbs, non-prescription drugs, or dietary supplements you use. Also tell them if you smoke, drink alcohol, or use illegal drugs. Some items may interact with your medicine. What should I watch for while using this medication? Visit your care team for regular checks on your progress. It may be some time before you see the benefit from this medication. Check with your care team if you have severe diarrhea, nausea, and vomiting, or if you sweat a lot. The loss of too much body fluid may make it dangerous for you to take this medication. Tell your care team if you are  taking medications to treat diabetes, such as insulin or sulfonylureas. This may increase your risk of low blood sugar. Know the symptoms of low blood sugar and how to treat it. Talk to your care team about your risk of cancer. You may be more at risk for certain types of cancer if you take this medication. Estrogen and progestin hormones may not work as well while you are taking this medication. If  you take these as pills by mouth, your care team may recommend another type of contraception for 4 weeks after you start this medication and for 4 weeks after each dose increase. Talk to your care team about contraceptive options. They can help you find the option that works for you. What side effects may I notice from receiving this medication? Side effects that you should report to your care team as soon as possible: Allergic reactions or angioedema--skin rash, itching or hives, swelling of the face, eyes, lips, tongue, arms, or legs, trouble swallowing or breathing Bowel blockage--stomach cramping, unable to have a bowel movement or pass gas, loss of appetite, vomiting Change in vision Dehydration--increased thirst, dry mouth, feeling faint or lightheaded, headache, dark yellow or brown urine Gallbladder problems--severe stomach pain, nausea, vomiting, fever Kidney injury--decrease in the amount of urine, swelling of the ankles, hands, or feet Pancreatitis--severe stomach pain that spreads to your back or gets worse after eating or when touched, fever, nausea, vomiting Thoughts of suicide or self-harm, worsening mood, feelings of depression Thyroid cancer--new mass or lump in the neck, pain or trouble swallowing, trouble breathing, hoarseness Side effects that usually do not require medical attention (report these to your care team if they continue or are bothersome): Diarrhea Loss of appetite Nausea Upset stomach This list may not describe all possible side effects. Call your doctor for medical advice about side effects. You may report side effects to FDA at 1-800-FDA-1088. Where should I keep my medication? Keep out of the reach of children and pets. Store in a refrigerator or at room temperature up to 30 degrees C (86 degrees F). Keep it in the original container. Protect from light. Refrigeration (preferred): Store in the refrigerator. Do not freeze. Get rid of any unused medication after  the expiration date. Room temperature: This medication may be stored at room temperature for up to 21 days. If it is stored at room temperature, get rid of any unused medication after 21 days or after it expires, whichever is first. To get rid of medications that are no longer needed or have expired: Take the medication to a medication take-back program. Check with your pharmacy or law enforcement to find a location. If you cannot return the medication, ask your pharmacist or care team how to get rid of this medication safely. NOTE: This sheet is a summary. It may not cover all possible information. If you have questions about this medicine, talk to your doctor, pharmacist, or health care provider.  2024 Elsevier/Gold Standard (2022-05-03 00:00:00)

## 2022-11-29 ENCOUNTER — Encounter: Payer: Self-pay | Admitting: Physician Assistant

## 2022-12-07 ENCOUNTER — Telehealth: Payer: Self-pay | Admitting: Physician Assistant

## 2022-12-07 NOTE — Telephone Encounter (Signed)
Patient's wife called in stating that Zepbound 2.5mg  needs a PA.  Also, all RX's need to go through [facility] EXCEPT Zebound

## 2022-12-15 NOTE — Telephone Encounter (Signed)
Spouse called.  She is following up on PA for Zepbound.Marland Kitchen

## 2022-12-20 NOTE — Telephone Encounter (Signed)
Spouse called to follow up on script request for Zepbound.

## 2022-12-23 NOTE — Telephone Encounter (Addendum)
Initiated Prior authorization WUJ:[id number] 2.5MG /0.5ML pen-injectors Via: Covermymeds Case/Key:[id number] Status: approved  as of 96/24 Reason:Authorization Expiration Date: 08/19/2023  Notified Pt via: Mychart

## 2022-12-29 ENCOUNTER — Telehealth: Payer: Self-pay | Admitting: Physician Assistant

## 2022-12-29 NOTE — Telephone Encounter (Signed)
Patients spouse called in wanting to know the update on Zepbound, states the pharmacy stated they are looking for another alternative for the medication and there is so no PA for the medication.

## 2022-12-30 DIAGNOSIS — M48061 Spinal stenosis, lumbar region without neurogenic claudication: Secondary | ICD-10-CM | POA: Diagnosis not present

## 2022-12-30 DIAGNOSIS — M5136 Other intervertebral disc degeneration, lumbar region: Secondary | ICD-10-CM | POA: Diagnosis not present

## 2023-01-02 NOTE — Telephone Encounter (Signed)
Patient's wife called stating their insurance has just changed and will not cover Zepbound and is asking to put the PA on hold.

## 2023-01-07 ENCOUNTER — Other Ambulatory Visit: Payer: Self-pay | Admitting: Physician Assistant

## 2023-01-07 DIAGNOSIS — F331 Major depressive disorder, recurrent, moderate: Secondary | ICD-10-CM

## 2023-01-07 DIAGNOSIS — I10 Essential (primary) hypertension: Secondary | ICD-10-CM

## 2023-01-11 ENCOUNTER — Telehealth: Payer: Self-pay | Admitting: Physician Assistant

## 2023-01-11 DIAGNOSIS — F331 Major depressive disorder, recurrent, moderate: Secondary | ICD-10-CM

## 2023-01-11 DIAGNOSIS — I10 Essential (primary) hypertension: Secondary | ICD-10-CM

## 2023-01-11 MED ORDER — ESCITALOPRAM OXALATE 20 MG PO TABS: 20.0000 mg | ORAL_TABLET | Freq: Every day | ORAL | 3 refills | Status: DC

## 2023-01-11 MED ORDER — HYDROCHLOROTHIAZIDE 12.5 MG PO TABS: 12.5000 mg | ORAL_TABLET | Freq: Every day | ORAL | 3 refills | Status: DC

## 2023-01-11 NOTE — Telephone Encounter (Addendum)
Prescription Request  01/11/2023  LOV: 11/28/2022  What is the name of the medication or equipment? escitalopram (LEXAPRO) 20 MG tablet hydrochlorothiazide (HYDRODIURIL) 12.5 MG tablet   Have you contacted your pharmacy to request a refill? Yes   Which pharmacy would you like this sent to?   [facility] [phone] - [place], [place] - [address] [place] [address] Phone: [phone] Fax: [phone]    Patient notified that their request is being sent to the clinical staff for review and that they should receive a response within 2 business days.   Please advise at [facility] [phone]

## 2023-01-17 ENCOUNTER — Ambulatory Visit: Payer: BC Managed Care – PPO | Admitting: Physician Assistant

## 2023-01-17 ENCOUNTER — Emergency Department (HOSPITAL_COMMUNITY): Payer: BC Managed Care – PPO

## 2023-01-17 ENCOUNTER — Other Ambulatory Visit: Payer: Self-pay

## 2023-01-17 ENCOUNTER — Emergency Department (HOSPITAL_COMMUNITY)
Admission: EM | Admit: 2023-01-17 | Discharge: 2023-01-17 | Disposition: A | Discharge status: Home / Self Care | Payer: BC Managed Care – PPO | Attending: Emergency Medicine | Admitting: Emergency Medicine

## 2023-01-17 ENCOUNTER — Telehealth: Payer: Self-pay | Admitting: Physician Assistant

## 2023-01-17 ENCOUNTER — Encounter (HOSPITAL_COMMUNITY): Payer: Self-pay

## 2023-01-17 DIAGNOSIS — R002 Palpitations: Secondary | ICD-10-CM | POA: Diagnosis not present

## 2023-01-17 DIAGNOSIS — I491 Atrial premature depolarization: Secondary | ICD-10-CM | POA: Diagnosis not present

## 2023-01-17 DIAGNOSIS — J9811 Atelectasis: Secondary | ICD-10-CM | POA: Diagnosis not present

## 2023-01-17 DIAGNOSIS — I10 Essential (primary) hypertension: Secondary | ICD-10-CM | POA: Diagnosis not present

## 2023-01-17 DIAGNOSIS — Z79899 Other long term (current) drug therapy: Secondary | ICD-10-CM | POA: Diagnosis not present

## 2023-01-17 DIAGNOSIS — R42 Dizziness and giddiness: Secondary | ICD-10-CM | POA: Diagnosis not present

## 2023-01-17 DIAGNOSIS — R079 Chest pain, unspecified: Secondary | ICD-10-CM | POA: Diagnosis not present

## 2023-01-17 LAB — CBC WITH DIFFERENTIAL/PLATELET
Abs Immature Granulocytes: 0.04 10*3/uL (ref 0.00–0.07)
Basophils Absolute: 0 10*3/uL (ref 0.0–0.1)
Basophils Relative: 1 %
Eosinophils Absolute: 0.1 10*3/uL (ref 0.0–0.5)
Eosinophils Relative: 2 %
HCT: 39.2 % (ref 39.0–52.0)
Hemoglobin: 12.2 g/dL — ABNORMAL LOW (ref 13.0–17.0)
Immature Granulocytes: 1 %
Lymphocytes Relative: 21 %
Lymphs Abs: 1.6 10*3/uL (ref 0.7–4.0)
MCH: 29.1 pg (ref 26.0–34.0)
MCHC: 31.1 g/dL (ref 30.0–36.0)
MCV: 93.6 fL (ref 80.0–100.0)
Monocytes Absolute: 0.4 10*3/uL (ref 0.1–1.0)
Monocytes Relative: 6 %
Neutro Abs: 5.5 10*3/uL (ref 1.7–7.7)
Neutrophils Relative %: 69 %
Platelets: 153 10*3/uL (ref 150–400)
RBC: 4.19 MIL/uL — ABNORMAL LOW (ref 4.22–5.81)
RDW: 15.3 % (ref 11.5–15.5)
WBC: 7.8 10*3/uL (ref 4.0–10.5)
nRBC: 0 % (ref 0.0–0.2)

## 2023-01-17 LAB — HEPATIC FUNCTION PANEL
ALT: 40 U/L (ref 0–44)
AST: 35 U/L (ref 15–41)
Albumin: 4 g/dL (ref 3.5–5.0)
Alkaline Phosphatase: 95 U/L (ref 38–126)
Bilirubin, Direct: 0.3 mg/dL — ABNORMAL HIGH (ref 0.0–0.2)
Indirect Bilirubin: 1.4 mg/dL — ABNORMAL HIGH (ref 0.3–0.9)
Total Bilirubin: 1.7 mg/dL — ABNORMAL HIGH (ref 0.3–1.2)
Total Protein: 6.8 g/dL (ref 6.5–8.1)

## 2023-01-17 LAB — BASIC METABOLIC PANEL
Anion gap: 8 (ref 5–15)
BUN: 17 mg/dL (ref 8–23)
CO2: 24 mmol/L (ref 22–32)
Calcium: 8.5 mg/dL — ABNORMAL LOW (ref 8.9–10.3)
Chloride: 105 mmol/L (ref 98–111)
Creatinine, Ser: 1.02 mg/dL (ref 0.61–1.24)
GFR, Estimated: >60 mL/min (ref >60)
Glucose, Bld: 99 mg/dL (ref 70–99)
Potassium: 3.8 mmol/L (ref 3.5–5.1)
Sodium: 137 mmol/L (ref 135–145)

## 2023-01-17 LAB — TROPONIN I (HIGH SENSITIVITY)
Troponin I (High Sensitivity): 4 ng/L (ref <18)
Troponin I (High Sensitivity): 5 ng/L (ref <18)

## 2023-01-17 LAB — MAGNESIUM: Magnesium: 1.9 mg/dL (ref 1.7–2.4)

## 2023-01-17 LAB — CBG MONITORING, ED: Glucose-Capillary: 63 mg/dL — ABNORMAL LOW (ref 70–99)

## 2023-01-17 MED ORDER — AMLODIPINE BESYLATE 5 MG PO TABS: 5.0000 mg | ORAL_TABLET | Freq: Every day | ORAL | 2 refills | Status: DC

## 2023-01-17 MED ORDER — LACTATED RINGERS IV BOLUS
500.0000 mL | Freq: Once | INTRAVENOUS | Status: AC
  Administered 2023-01-17: 500 mL via INTRAVENOUS

## 2023-01-17 NOTE — ED Triage Notes (Signed)
BIB EMS from work for high blood pressure and dizziness. EMS gave 324 of ASA. Pt states they are changing his BP meds around, he has not missed any doses.  BP 198/100 with EMS

## 2023-01-17 NOTE — Telephone Encounter (Signed)
You're welcome!

## 2023-01-17 NOTE — Telephone Encounter (Signed)
Patient's wife called stated patient is on the way to the [facility] on [address] she says he is not doing well

## 2023-01-17 NOTE — ED Provider Notes (Signed)
67 year old male with past medical history of hypertension, GERD, and chronic back pain presents emergency department today with intermittent dizziness and palpitations with left-sided chest pain over the past few months.  I was asked to follow-up on the patient's CT scan as well as a second troponin.   Physical Exam  BP (!) 152/103 (BP Location: Right Arm)   Pulse (!) 58   Temp 98 F (36.7 C) (Oral)   Resp 18   Ht 6\' 2"  (1.88 m)   Wt 136.1 kg   SpO2 100%   BMI 38.52 kg/m   Physical Exam Vitals and nursing note reviewed.  Constitutional:      Appearance: Normal appearance.  Cardiovascular:     Rate and Rhythm: Normal rate and regular rhythm.  Pulmonary:     Effort: Pulmonary effort is normal.     Breath sounds: Normal breath sounds.  Neurological:     Mental Status: He is alert.     Procedures  Procedures  ED Course / MDM    Medical Decision Making Amount and/or Complexity of Data Reviewed Labs: ordered. Radiology: ordered. ECG/medicine tests: ordered.  Risk Prescription drug management.   His EKG interpreted by me shows a sinus bradycardia with a rate of 55 with normal axis, normal intervals, and no significant ST-T changes with some baseline artifact.  The patient's echo troponin is flat.  CT scan is unremarkable.  Plan is to discharge patient with cardiology follow-up.       Durwin Glaze, MD 01/17/23 [phone]

## 2023-01-17 NOTE — ED Notes (Signed)
Pt pulled IV out while going to the bathroom.  No distress.  Pt is alert and oriented.  Wife is at the bedside.

## 2023-01-17 NOTE — Discharge Instructions (Signed)
A prescription for a new blood pressure medication was sent to your pharmacy.  Take this in addition to your other prescribed medicines.  Continue to monitor your blood pressure at home and follow-up with your primary care doctor for further medication management.  A referral was sent for cardiology follow-up.  If you do not hear from their office in the next couple days, call the number below.  Return to the emergency department for any new or worsening symptoms of concern.

## 2023-01-17 NOTE — ED Provider Notes (Signed)
[place] [facility] EMERGENCY DEPARTMENT AT [facility] Provider Note   CSN: [id number] Arrival date & time: 01/17/23  1136     History  Chief Complaint  Patient presents with   Hypertension    Kenneth Duncan is a 67 y.o. male.   Hypertension Associated symptoms include chest pain.  Patient presents for episode of dizziness.  Medical history includes HTN, GERD, chronic back pain, OSA, anxiety, BPH.  Current medications include 40 mg Benicar daily and 12.5 mg HCTZ daily.  Over the past 6 months, he has had intermittent episodes of dizziness, palpitations, left-sided chest pain.  He was started on the HCTZ 3 months ago.  He feels that the episodes have increased in frequency since that time.  Currently, he will experience it approximately 1 time per week.  This morning, he was in his normal state of health.  While at work, he was sitting at the desk.  When he stood up, he had a similar episode.  His coworkers felt that he looked pale and unwell.  When he checked his blood pressure, it was elevated in the range of 190 systolic.  EMS was called.  EMS gave 324 of ASA prior to arrival.  Episode today lasted for approximately 30 minutes.  Currently, he denies any symptoms.  He did take his blood pressure medications this morning.  Although his episode today was not related to exertion, he states that he is active at work and walks a lot.  Sometimes he is out in the heat.  He feels that he is not drink as much fluids as he should.     Home Medications Prior to Admission medications   Medication Sig Start Date End Date Taking? Authorizing Provider  amLODipine (NORVASC) 5 MG tablet Take 1 tablet (5 mg total) by mouth daily. 01/17/23 04/17/23 Yes Gloris Manchester, MD  AMBULATORY NON FORMULARY MEDICATION CPAP pressure to be auto titration 5-20cm H20 and mask adjusted as needed. Diagnosis: OSA 11/05/15   Breeback, Jade L, PA-C  AMBULATORY NON FORMULARY MEDICATION Syringes and 1" 25g needles for B12  injections 05/18/18   Breeback, Jade L, PA-C  AMBULATORY NON FORMULARY MEDICATION Flex it Tens Unit to use as needed for muscle/back pain. 10/02/18   Breeback, Jade L, PA-C  atorvastatin (LIPITOR) 20 MG tablet Take 1 tablet (20 mg total) by mouth daily. 01/20/22   Breeback, Jade L, PA-C  cyanocobalamin (VITAMIN B12) 1000 MCG/ML injection Inject 1 mL (1,000 mcg total) into the muscle every 30 (thirty) days. 01/20/22   Breeback, Jade L, PA-C  dapsone 100 MG tablet Take 1 tablet (100 mg total) by mouth 2 (two) times daily. TAKE 1 TABLET (100 MG TOTAL) BY MOUTH 2 (TWO) TIMES DAILY. 11/28/22   Breeback, Jade L, PA-C  escitalopram (LEXAPRO) 20 MG tablet Take 1 tablet (20 mg total) by mouth daily. 01/11/23   Breeback, Lonna Cobb, PA-C  fexofenadine (ALLEGRA ALLERGY) 180 MG tablet Take 1 tablet (180 mg total) by mouth daily for 15 days. 07/10/22 07/25/22  Trevor Iha, FNP  fluticasone (FLONASE) 50 MCG/ACT nasal spray SPRAY 2 SPRAYS INTO EACH NOSTRIL EVERY DAY 01/20/22   Breeback, Jade L, PA-C  hydrochlorothiazide (HYDRODIURIL) 12.5 MG tablet Take 1 tablet (12.5 mg total) by mouth daily. 01/11/23   Breeback, Jade L, PA-C  ibuprofen (ADVIL) 800 MG tablet Take 1 tablet (800 mg total) by mouth every 8 (eight) hours as needed. 11/28/22   Breeback, Jade L, PA-C  olmesartan (BENICAR) 40 MG tablet Take 1 tablet (  40 mg total) by mouth daily. 11/28/22   Breeback, Lonna Cobb, PA-C  omeprazole (PRILOSEC) 40 MG capsule Take 1 capsule (40 mg total) by mouth daily. 11/08/22   Breeback, Jade L, PA-C  tirzepatide (ZEPBOUND) 2.5 MG/0.5ML Pen Inject 2.5 mg into the skin once a week. 11/28/22   Breeback, Jade L, PA-C  tirzepatide (ZEPBOUND) 5 MG/0.5ML Pen Inject 5 mg into the skin once a week. 12/29/22   Breeback, Jade L, PA-C  triamcinolone cream (KENALOG) 0.1 % Apply 1 Application topically 2 (two) times daily. 01/20/22   Breeback, Jade L, PA-C  Vitamin D, Ergocalciferol, (DRISDOL) 1.25 MG (50000 UNIT) CAPS capsule TAKE 1 CAPSULE (50,000 UNITS  TOTAL) BY MOUTH EVERY 7 (SEVEN) DAYS 10/19/22   Tandy Gaw L, PA-C      Allergies    Lisinopril and Penicillins    Review of Systems   Review of Systems  Cardiovascular:  Positive for chest pain and palpitations.  Neurological:  Positive for dizziness.  All other systems reviewed and are negative.   Physical Exam Updated Vital Signs BP (!) 163/96 (BP Location: Right Arm) Comment: Simultaneous filing. User may not have seen previous data.  Pulse (!) 52 Comment: Simultaneous filing. User may not have seen previous data.  Temp 98 F (36.7 C) (Oral)   Resp 18   Ht 6\' 2"  (1.88 m)   Wt 136.1 kg   SpO2 98% Comment: Simultaneous filing. User may not have seen previous data.  BMI 38.52 kg/m  Physical Exam Vitals and nursing note reviewed.  Constitutional:      General: He is not in acute distress.    Appearance: Normal appearance. He is well-developed. He is not ill-appearing, toxic-appearing or diaphoretic.  HENT:     Head: Normocephalic and atraumatic.     Right Ear: External ear normal.     Left Ear: External ear normal.     Nose: Nose normal.     Mouth/Throat:     Mouth: Mucous membranes are moist.  Eyes:     Extraocular Movements: Extraocular movements intact.     Conjunctiva/sclera: Conjunctivae normal.  Cardiovascular:     Rate and Rhythm: Normal rate and regular rhythm.  Pulmonary:     Effort: Pulmonary effort is normal. No respiratory distress.     Breath sounds: Normal breath sounds. No wheezing or rales.  Abdominal:     Palpations: Abdomen is soft.     Tenderness: There is no abdominal tenderness.  Musculoskeletal:        General: No swelling. Normal range of motion.     Cervical back: Normal range of motion and neck supple.     Right lower leg: No edema.     Left lower leg: No edema.  Skin:    General: Skin is warm and dry.     Coloration: Skin is not jaundiced or pale.  Neurological:     General: No focal deficit present.     Mental Status: He is alert  and oriented to person, place, and time.     Cranial Nerves: No cranial nerve deficit.     Sensory: No sensory deficit.     Motor: No weakness.     Coordination: Coordination normal.  Psychiatric:        Mood and Affect: Mood normal.        Behavior: Behavior normal.     ED Results / Procedures / Treatments   Labs (all labs ordered are listed, but only abnormal results are displayed) Labs  Reviewed  BASIC METABOLIC PANEL - Abnormal; Notable for the following components:      Result Value   Calcium 8.5 (*)    All other components within normal limits  CBC WITH DIFFERENTIAL/PLATELET - Abnormal; Notable for the following components:   RBC 4.19 (*)    Hemoglobin 12.2 (*)    All other components within normal limits  HEPATIC FUNCTION PANEL - Abnormal; Notable for the following components:   Total Bilirubin 1.7 (*)    Bilirubin, Direct 0.3 (*)    Indirect Bilirubin 1.4 (*)    All other components within normal limits  CBG MONITORING, ED - Abnormal; Notable for the following components:   Glucose-Capillary 63 (*)    All other components within normal limits  MAGNESIUM  TROPONIN I (HIGH SENSITIVITY)  TROPONIN I (HIGH SENSITIVITY)    EKG EKG Interpretation Date/Time:  Tuesday January 17 2023 11:49:24 EDT Ventricular Rate:  61 PR Interval:  187 QRS Duration:  99 QT Interval:  437 QTC Calculation: 441 R Axis:   -37  Text Interpretation: Sinus rhythm Left axis deviation Abnormal R-wave progression, late transition Confirmed by Gloris Manchester [phone]) on 01/17/2023 1:46:57 PM  Radiology DG Chest Port 1 View  Result Date: 01/17/2023 CLINICAL DATA:  chest pain EXAM: PORTABLE CHEST 1 VIEW COMPARISON:  Chest radiograph dated May 24, 2014. FINDINGS: The heart size and mediastinal contours are within normal limits. Mild left basilar atelectasis. No focal consolidation, pneumothorax, or sizable pleural effusion. No acute osseous abnormality. IMPRESSION: No active cardiopulmonary disease.  Electronically Signed   By: Hart Robinsons M.D.   On: 01/17/2023 16:16    Procedures Procedures    Medications Ordered in ED Medications  lactated ringers bolus 500 mL (500 mLs Intravenous New Bag/Given 01/17/23 1416)    ED Course/ Medical Decision Making/ A&P                                 Medical Decision Making Amount and/or Complexity of Data Reviewed Labs: ordered. Radiology: ordered. ECG/medicine tests: ordered.   This patient presents to the ED for concern of chest pain and dizziness, this involves an extensive number of treatment options, and is a complaint that carries with it a high risk of complications and morbidity.  The differential diagnosis includes vasovagal episode, ACS, arrhythmia, panic attack   Co morbidities that complicate the patient evaluation  HTN, GERD, chronic back pain, OSA, anxiety, BPH   Additional history obtained:  Additional history obtained from N/A External records from outside source obtained and reviewed including EMR   Lab Tests:  I Ordered, and personally interpreted labs.  The pertinent results include: Anemia slightly decreased from 2 years ago.  No leukocytosis is present.  Kidney function and electrolytes are normal.  Initial troponin is normal   Imaging Studies ordered:  I ordered imaging studies including chest x-ray, CT head I independently visualized and interpreted imaging which showed no acute findings on x-ray, CT pending at time of signout. I agree with the radiologist interpretation   Cardiac Monitoring: / EKG:  The patient was maintained on a cardiac monitor.  I personally viewed and interpreted the cardiac monitored which showed an underlying rhythm of: Sinus rhythm  Problem List / ED Course / Critical interventions / Medication management  Patient presenting for episode of dizziness, chest pain, and palpitations.  He has been experiencing these intermittently over the past 6 months.  Today, he was  hypertensive.  Blood pressure is since improved.  Symptoms have resolved.  He is well-appearing on arrival.  He has no focal neurologic deficits.  No cardiac rubs or murmurs are appreciated on auscultation.  EKG shows normal sinus rhythm.  Patient was placed on bedside cardiac monitor.  Workup was initiated.  Blood glucose was found to be low-normal.  He was provided with juice.  He is not on diabetic medications.  Lab results are reassuring.  Patient feels improved after IV fluids.  Blood pressure remained in the range of 160s over 100s.  He was agreeable to trial of low-dose amlodipine.  This was prescribed.  Patient to continue to check his blood pressures at home and follow-up with PCP.  Given his age and risk factors, he would benefit from cardiology follow-up as well.  Referral was ordered.  At time of signout, second troponin was pending.  I do anticipate discharge.  Care of patient was signed out to oncoming ED provider. I ordered medication including IV fluids for hydration Reevaluation of the patient after these medicines showed that the patient improved I have reviewed the patients home medicines and have made adjustments as needed   Social Determinants of Health:  Has PCP         Final Clinical Impression(s) / ED Diagnoses Final diagnoses:  Dizziness  Palpitations    Rx / DC Orders ED Discharge Orders          Ordered    amLODipine (NORVASC) 5 MG tablet  Daily        01/17/23 1655    Ambulatory referral to Cardiology       Comments: If you have not heard from the Cardiology office within the next 72 hours please call [phone].   01/17/23 1656              Gloris Manchester, MD 01/17/23 1658

## 2023-01-18 ENCOUNTER — Telehealth: Payer: Self-pay | Admitting: Physician Assistant

## 2023-01-18 NOTE — Telephone Encounter (Signed)
Patient's wife called in stating the patient was seen in [facility] ED, states that he blood pressure was elevated and was put on new medication but is unsure about it

## 2023-01-20 ENCOUNTER — Ambulatory Visit: Payer: BC Managed Care – PPO | Admitting: Physician Assistant

## 2023-01-20 ENCOUNTER — Telehealth: Payer: Self-pay | Admitting: Physician Assistant

## 2023-01-20 VITALS — BP 166/84 | HR 66 | Ht 74.0 in | Wt 284.0 lb

## 2023-01-20 DIAGNOSIS — R002 Palpitations: Secondary | ICD-10-CM | POA: Diagnosis not present

## 2023-01-20 DIAGNOSIS — I10 Essential (primary) hypertension: Secondary | ICD-10-CM

## 2023-01-20 DIAGNOSIS — R42 Dizziness and giddiness: Secondary | ICD-10-CM | POA: Insufficient documentation

## 2023-01-20 DIAGNOSIS — Z13220 Encounter for screening for lipoid disorders: Secondary | ICD-10-CM

## 2023-01-20 NOTE — Telephone Encounter (Signed)
BP should be under 140/90 before getting implants.

## 2023-01-20 NOTE — Telephone Encounter (Signed)
Kenneth Duncan called.  Patient has an appointment with oral surgeon to get 2 implants on October 10th  and she is not sure he should since he was seen in ED for high blood pressure problems.  Please advise.

## 2023-01-20 NOTE — Patient Instructions (Addendum)
Start wearing compression stockings Start norvasc 2.5mg  cut 5mg  in half Follow up BP check in 2 weeks  Orthostatic Hypotension Blood pressure is a measurement of how strongly, or weakly, your circulating blood is pressing against the walls of your arteries. Orthostatic hypotension is a drop in blood pressure that can happen when you change positions, such as when you go from lying down to standing. Arteries are blood vessels that carry blood from your heart throughout your body. When blood pressure is too low, you may not get enough blood to your brain or to the rest of your organs. Orthostatic hypotension can cause light-headedness, sweating, rapid heartbeat, blurred vision, and fainting. These symptoms require further investigation into the cause. What are the causes? Orthostatic hypotension can be caused by many things, including: Sudden changes in posture, such as standing up quickly after you have been sitting or lying down. Loss of blood (anemia) or loss of body fluids (dehydration). Heart problems, neurologic problems, or hormone problems. Pregnancy. Aging. The risk for this condition increases as you get older. Severe infection (sepsis). Certain medicines, such as medicines for high blood pressure or medicines that make the body lose excess fluids (diuretics). What are the signs or symptoms? Symptoms of this condition may include: Weakness, light-headedness, or dizziness. Sweating. Blurred vision. Tiredness (fatigue). Rapid heartbeat. Fainting, in severe cases. How is this diagnosed? This condition is diagnosed based on: Your symptoms and medical history. Your blood pressure measurements. Your health care provider will check your blood pressure when you are: Lying down. Sitting. Standing. A blood pressure reading is recorded as two numbers, such as "120 over 80" (or 120/80). The first ("top") number is called the systolic pressure. It is a measure of the pressure in your  arteries as your heart beats. The second ("bottom") number is called the diastolic pressure. It is a measure of the pressure in your arteries when your heart relaxes between beats. Blood pressure is measured in a unit called mmHg. Healthy blood pressure for most adults is 120/80 mmHg. Orthostatic hypotension is defined as a 20 mmHg drop in systolic pressure or a 10 mmHg drop in diastolic pressure within 3 minutes of standing. Other information or tests that may be used to diagnose orthostatic hypotension include: Your other vital signs, such as your heart rate and temperature. Blood tests. An electrocardiogram (ECG) or echocardiogram. A Holter monitor. This is a device you wear that records your heart rhythm continuously, usually for 24-48 hours. Tilt table test. For this test, you will be safely secured to a table that moves you from a lying position to an upright position. Your heart rhythm and blood pressure will be monitored during the test. How is this treated? This condition may be treated by: Changing your diet. This may involve eating more salt (sodium) or drinking more water. Changing the dosage of certain medicines you are taking that might be lowering your blood pressure. Correcting the underlying reason for the orthostatic hypotension. Wearing compression stockings. Taking medicines to raise your blood pressure. Avoiding actions that trigger symptoms. Follow these instructions at home: Medicines Take over-the-counter and prescription medicines only as told by your health care provider. Follow instructions from your health care provider about changing the dosage of your current medicines, if this applies. Do not stop or adjust any of your medicines on your own. Eating and drinking  Drink enough fluid to keep your urine pale yellow. Eat extra salt only as directed. Do not add extra salt to your diet unless  advised by your health care provider. Eat frequent, small meals. Avoid  standing up suddenly after eating. General instructions  Get up slowly from lying down or sitting positions. This gives your blood pressure a chance to adjust. Avoid hot showers and excessive heat as directed by your health care provider. Engage in regular physical activity as directed by your health care provider. If you have compression stockings, wear them as told. Keep all follow-up visits. This is important. Contact a health care provider if: You have a fever for more than 2-3 days. You feel more thirsty than usual. You feel dizzy or weak. Get help right away if: You have chest pain. You have a fast or irregular heartbeat. You become sweaty or feel light-headed. You feel short of breath. You faint. You have any symptoms of a stroke. "BE FAST" is an easy way to remember the main warning signs of a stroke: B - Balance. Signs are dizziness, sudden trouble walking, or loss of balance. E - Eyes. Signs are trouble seeing or a sudden change in vision. F - Face. Signs are sudden weakness or numbness of the face, or the face or eyelid drooping on one side. A - Arms. Signs are weakness or numbness in an arm. This happens suddenly and usually on one side of the body. S - Speech. Signs are sudden trouble speaking, slurred speech, or trouble understanding what people say. T - Time. Time to call emergency services. Write down what time symptoms started. You have other signs of a stroke, such as: A sudden, severe headache with no known cause. Nausea or vomiting. Seizure. These symptoms may represent a serious problem that is an emergency. Do not wait to see if the symptoms will go away. Get medical help right away. Call your local emergency services (911 in the [place] Do not drive yourself to the hospital. Summary Orthostatic hypotension is a sudden drop in blood pressure. It can cause light-headedness, sweating, rapid heartbeat, blurred vision, and fainting. Orthostatic hypotension can be  diagnosed by having your blood pressure taken while lying down, sitting, and then standing. Treatment may involve changing your diet, wearing compression stockings, sitting up slowly, adjusting your medicines, or correcting the underlying reason for the orthostatic hypotension. Get help right away if you have chest pain, a fast or irregular heartbeat, or symptoms of a stroke. This information is not intended to replace advice given to you by your health care provider. Make sure you discuss any questions you have with your health care provider. Document Revised: 06/18/2020 Document Reviewed: 06/18/2020 Elsevier Patient Education  2024 [employer].

## 2023-01-20 NOTE — Progress Notes (Unsigned)
Established Patient Office Visit  Subjective   Patient ID: Kenneth Duncan, male    DOB: Aug 20, 1955  Age: 67 y.o. MRN: [id number]  Chief Complaint  Patient presents with   Hospitalization Follow-up    Ed follow up    HPI Pt is a 67 yo obese male with HTN, GERD, chronic back pain who presented to ED on 01/17/2023 with intermittent dizziness, palpitations and left sided chest pain over the last few months that seem to be getting worse. EKG with no significant findings. Troponin within normal limits, CT unremarkable. Pt's BP was 190s systolic. He was started on norvasc 5mg  and told to follow up with PCP. He has cardiology visit scheduled for November 19th.   He continues to get dizzy when he stands often.    .. Active Ambulatory Problems    Diagnosis Date Noted   Chronic low back pain 06/14/2011   Degenerative disc disease, lumbar 06/14/2011   Essential hypertension 06/14/2011   GERD (gastroesophageal reflux disease) 06/14/2011   Anxiety disorder due to general medical condition 06/14/2011   Celiac disease 06/14/2011   B12 deficiency 07/28/2012   Herpetiformis dermatitis 07/28/2012   Hemorrhoid prolapse 08/15/2013   OSA (obstructive sleep apnea) 08/24/2015   Benign prostatic hyperplasia 10/21/2015   No energy 10/21/2015   Elevated LDL cholesterol level 10/28/2015   Vitamin D deficiency 10/28/2015   History of colon polyps 11/01/2016   Angio-edema 03/07/2017   Chronic SI joint pain 05/21/2017   Lumbar herniated disc 05/23/2017   Lumbar foraminal stenosis 05/23/2017   Piriformis syndrome of both sides 10/09/2018   Primary osteoarthritis of left knee 11/01/2019   Seasonal allergies 10/27/2020   Gastroesophageal reflux disease with esophagitis 10/27/2020   Moderate episode of recurrent major depressive disorder (HCC) 10/27/2020   Class 3 severe obesity due to excess calories without serious comorbidity with body mass index (BMI) of 40.0 to 44.9 in adult [employer]) 11/29/2022    Palpitations 01/20/2023   Uncontrolled hypertension 01/20/2023   Dizziness 01/20/2023   Resolved Ambulatory Problems    Diagnosis Date Noted   No Resolved Ambulatory Problems   Past Medical History:  Diagnosis Date   Hypertension      ROS See HPI.    Objective:     BP (!) 166/84   Pulse 66   Ht 6\' 2"  (1.88 m)   Wt 284 lb (128.8 kg)   SpO2 99%   BMI 36.46 kg/m  BP Readings from Last 3 Encounters:  01/20/23 (!) 166/84  01/17/23 (!) 152/103  11/28/22 131/70   Wt Readings from Last 3 Encounters:  01/20/23 284 lb (128.8 kg)  01/17/23 300 lb (136.1 kg)  11/28/22 (!) 303 lb (137.4 kg)     Physical Exam Constitutional:      Appearance: Normal appearance. He is obese.  HENT:     Head: Normocephalic.  Cardiovascular:     Rate and Rhythm: Normal rate and regular rhythm.  Pulmonary:     Effort: Pulmonary effort is normal.     Breath sounds: Normal breath sounds.  Musculoskeletal:     Right lower leg: No edema.     Left lower leg: No edema.  Neurological:     General: No focal deficit present.     Mental Status: He is alert and oriented to person, place, and time.  Psychiatric:        Mood and Affect: Mood normal.       The 10-year ASCVD risk score (Arnett DK, et al., 2019) is:  23.4%    Assessment & Plan:  Marland KitchenMarland KitchenChesley was seen today for hospitalization follow-up.  Diagnoses and all orders for this visit:  Uncontrolled hypertension -     Lipid panel  Essential hypertension -     Lipid panel  Dizziness  Palpitations -     TSH  Screening for lipid disorders -     Lipid panel   Pt appears stable today Reviewed labs in ED, needs lipid and TSH done.  BP not to goal. Orthostatic BP systolic dropped 22 Start norvasc 2.5mg  and see how BP adjust at home Goal under 130/80 follow up in 2 weeks Keep cardiology appt.   Return in about 2 weeks (around 02/03/2023) for BP recheck.    Tandy Gaw, PA-C

## 2023-01-21 LAB — TSH: TSH: 1.94 u[IU]/mL (ref 0.450–4.500)

## 2023-01-21 LAB — LIPID PANEL
Chol/HDL Ratio: 3.5 {ratio} (ref 0.0–5.0)
Cholesterol, Total: 143 mg/dL (ref 100–199)
HDL: 41 mg/dL (ref >39)
LDL Chol Calc (NIH): 69 mg/dL (ref 0–99)
Triglycerides: 196 mg/dL — ABNORMAL HIGH (ref 0–149)
VLDL Cholesterol Cal: 33 mg/dL (ref 5–40)

## 2023-01-23 ENCOUNTER — Telehealth: Payer: Self-pay | Admitting: Physician Assistant

## 2023-01-23 ENCOUNTER — Ambulatory Visit: Payer: BC Managed Care – PPO | Admitting: Physician Assistant

## 2023-01-23 ENCOUNTER — Other Ambulatory Visit: Payer: Self-pay | Admitting: Physician Assistant

## 2023-01-23 ENCOUNTER — Encounter: Payer: Self-pay | Admitting: Physician Assistant

## 2023-01-23 DIAGNOSIS — E538 Deficiency of other specified B group vitamins: Secondary | ICD-10-CM

## 2023-01-23 DIAGNOSIS — E7800 Pure hypercholesterolemia, unspecified: Secondary | ICD-10-CM

## 2023-01-23 DIAGNOSIS — K2100 Gastro-esophageal reflux disease with esophagitis, without bleeding: Secondary | ICD-10-CM

## 2023-01-23 DIAGNOSIS — K900 Celiac disease: Secondary | ICD-10-CM

## 2023-01-23 DIAGNOSIS — Z131 Encounter for screening for diabetes mellitus: Secondary | ICD-10-CM

## 2023-01-23 MED ORDER — ATORVASTATIN CALCIUM 20 MG PO TABS
20.0000 mg | ORAL_TABLET | Freq: Every day | ORAL | 3 refills | Status: DC
Start: 2023-01-23 — End: 2023-02-17

## 2023-01-23 MED ORDER — CYANOCOBALAMIN 1000 MCG/ML IJ SOLN: 1000.0000 ug | INTRAMUSCULAR | 3 refills | Status: AC

## 2023-01-23 NOTE — Progress Notes (Signed)
Kenneth Duncan,   LDL and HDL to goal.  TG a little elevated. Exercise and weight loss could help. Limiting sugars and processed foods can help.   Your 10 year CV risk is elevated. Stay on lipitor 20mg .   Marland Kitchen.The 10-year ASCVD risk score (Arnett DK, et al., 2019) is: 23.4%   Values used to calculate the score:     Age: 67 years     Sex: Male     Is Non-Hispanic African American: No     Diabetic: No     Tobacco smoker: No     Systolic Blood Pressure: 166 mmHg     Is BP treated: Yes     HDL Cholesterol: 41 mg/dL     Total Cholesterol: 143 mg/dL  Thyroid looks great.

## 2023-01-23 NOTE — Telephone Encounter (Signed)
error 

## 2023-02-03 ENCOUNTER — Ambulatory Visit: Payer: BC Managed Care – PPO

## 2023-02-16 ENCOUNTER — Telehealth: Payer: Self-pay | Admitting: Physician Assistant

## 2023-02-16 DIAGNOSIS — I10 Essential (primary) hypertension: Secondary | ICD-10-CM

## 2023-02-16 DIAGNOSIS — E7800 Pure hypercholesterolemia, unspecified: Secondary | ICD-10-CM

## 2023-02-16 NOTE — Telephone Encounter (Signed)
Patient called requesting a refill of ; amLODipine (NORVASC) 5 MG tablet [[id number]] and atorvastatin (LIPITOR) 20 MG tablet [[id number]] and olmesartan (BENICAR) 40 MG tablet [[id number]]   Pharmacy ;   [facility] [facility] - [place] - [address] [phone]

## 2023-02-17 MED ORDER — ATORVASTATIN CALCIUM 20 MG PO TABS
20.0000 mg | ORAL_TABLET | Freq: Every day | ORAL | 3 refills | Status: DC
Start: 2023-02-17 — End: 2023-12-26

## 2023-02-17 MED ORDER — OLMESARTAN MEDOXOMIL 40 MG PO TABS
40.0000 mg | ORAL_TABLET | Freq: Every day | ORAL | 3 refills | Status: DC
Start: 2023-02-17 — End: 2023-02-21

## 2023-02-17 MED ORDER — AMLODIPINE BESYLATE 5 MG PO TABS: 5.0000 mg | ORAL_TABLET | Freq: Every day | ORAL | 3 refills | Status: DC

## 2023-02-17 NOTE — Telephone Encounter (Signed)
Sent refills

## 2023-02-20 ENCOUNTER — Other Ambulatory Visit: Payer: Self-pay | Admitting: Physician Assistant

## 2023-02-20 DIAGNOSIS — I10 Essential (primary) hypertension: Secondary | ICD-10-CM

## 2023-02-21 ENCOUNTER — Telehealth: Payer: Self-pay | Admitting: Physician Assistant

## 2023-02-21 ENCOUNTER — Other Ambulatory Visit: Payer: Self-pay | Admitting: Physician Assistant

## 2023-02-21 DIAGNOSIS — I10 Essential (primary) hypertension: Secondary | ICD-10-CM

## 2023-02-21 MED ORDER — OLMESARTAN MEDOXOMIL 40 MG PO TABS: 40.0000 mg | ORAL_TABLET | Freq: Every day | ORAL | 3 refills | Status: DC

## 2023-02-21 NOTE — Addendum Note (Signed)
Addended by: Ernest Mallick A on: 02/21/2023 04:25 PM   Modules accepted: Orders

## 2023-02-21 NOTE — Telephone Encounter (Signed)
Patient is having trouble receiving this prescription through [facility] pharmacy   Prescription Request  02/21/2023  LOV: 01/20/2023  What is the name of the medication or equipment? olmesartan (BENICAR) 40 MG tablet   Have you contacted your pharmacy to request a refill? Yes   Which pharmacy would you like this sent to?   [facility] [phone] - [place], [place] - [address] [place] [address] Phone: [phone] Fax: [phone]    Patient notified that their request is being sent to the clinical staff for review and that they should receive a response within 2 business days.   Please advise at [facility] [phone]

## 2023-03-06 ENCOUNTER — Other Ambulatory Visit: Payer: Self-pay | Admitting: Physician Assistant

## 2023-03-06 DIAGNOSIS — E559 Vitamin D deficiency, unspecified: Secondary | ICD-10-CM

## 2023-03-07 ENCOUNTER — Ambulatory Visit: Payer: BC Managed Care – PPO | Attending: Cardiology | Admitting: Cardiology

## 2023-03-15 ENCOUNTER — Other Ambulatory Visit: Payer: Self-pay | Admitting: Physician Assistant

## 2023-03-15 DIAGNOSIS — K900 Celiac disease: Secondary | ICD-10-CM

## 2023-05-09 ENCOUNTER — Ambulatory Visit: Payer: Self-pay | Admitting: Physician Assistant

## 2023-05-09 NOTE — Telephone Encounter (Signed)
Copied from CRM [id number]. Topic: Clinical - Red Word Triage >> May 09, 2023  9:19 AM [name] wrote: Red Word that prompted transfer to Nurse Triage: shakey, dizzy, weakness, difficulty walking - patient's spouse is on the phone Kenneth Duncan   Chief Complaint: dizziness Symptoms: room moving, trouble walking, ear clogged Frequency: started today at 8a Pertinent Negatives: Patient denies headache Disposition: [] ED /[] Urgent Care (no appt availability in office) / [x] Appointment(In office/virtual)/ []  [facility]/ [] Home Care/ [] Refused Recommended Disposition /[] [facility]/ []  Follow-up with PCP Additional Notes: Patient reports dizziness, feels like room spinning. States that his RIGHT ear feels clogged. Pt states that he feels better when he lays down. Appt scheduled for tomorrow.   Reason for Disposition  [1] MODERATE dizziness (e.g., vertigo; feels very unsteady, interferes with normal activities) AND [2] has NOT been evaluated by doctor (or NP/PA) for this  Answer Assessment - Initial Assessment Questions 1. DESCRIPTION: "Describe your dizziness."     "Everything is moving  2. VERTIGO: "Do you feel like either you or the room is spinning or tilting?"      Spinning  3. LIGHTHEADED: "Do you feel lightheaded?" (e.g., somewhat faint, woozy, weak upon standing)     No  4. SEVERITY: "How bad is it?"  "Can you walk?"   - MILD: Feels slightly dizzy and unsteady, but is walking normally.   - MODERATE: Feels unsteady when walking, but not falling; interferes with normal activities (e.g., school, work).   - SEVERE: Unable to walk without falling, or requires assistance to walk without falling.     Moderate; feels better when sitting down. Left work early and drove home  5. ONSET:  "When did the dizziness begin?"     Started when get to work around 8 am  6. AGGRAVATING FACTORS: "Does anything make it worse?" (e.g., standing, change in head position)     Standing makes  worse  7. CAUSE: "What do you think is causing the dizziness?"     Unsure of cause  8. RECURRENT SYMPTOM: "Have you had dizziness before?" If Yes, ask: "When was the last time?" "What happened that time?"     Yes, last time it was blood pressure. But this is "different"; last time he felt light headed  9. OTHER SYMPTOMS: "Do you have any other symptoms?" (e.g., headache, weakness, numbness, vomiting, earache)     Right ear felt "clogged"  Protocols used: Dizziness - Vertigo-A-AH

## 2023-05-10 ENCOUNTER — Encounter: Payer: Self-pay | Admitting: Physician Assistant

## 2023-05-10 ENCOUNTER — Telehealth: Payer: Self-pay

## 2023-05-10 ENCOUNTER — Ambulatory Visit: Payer: BC Managed Care – PPO | Admitting: Physician Assistant

## 2023-05-10 VITALS — BP 150/90 | HR 64 | Temp 98.5°F | Ht 74.0 in | Wt 316.0 lb

## 2023-05-10 DIAGNOSIS — H6993 Unspecified Eustachian tube disorder, bilateral: Secondary | ICD-10-CM | POA: Diagnosis not present

## 2023-05-10 DIAGNOSIS — I10 Essential (primary) hypertension: Secondary | ICD-10-CM | POA: Diagnosis not present

## 2023-05-10 DIAGNOSIS — Z131 Encounter for screening for diabetes mellitus: Secondary | ICD-10-CM

## 2023-05-10 DIAGNOSIS — G4733 Obstructive sleep apnea (adult) (pediatric): Secondary | ICD-10-CM

## 2023-05-10 DIAGNOSIS — R42 Dizziness and giddiness: Secondary | ICD-10-CM | POA: Diagnosis not present

## 2023-05-10 DIAGNOSIS — E7800 Pure hypercholesterolemia, unspecified: Secondary | ICD-10-CM

## 2023-05-10 DIAGNOSIS — Z6841 Body Mass Index (BMI) 40.0 and over, adult: Secondary | ICD-10-CM

## 2023-05-10 DIAGNOSIS — E66813 Obesity, class 3: Secondary | ICD-10-CM

## 2023-05-10 LAB — POCT GLYCOSYLATED HEMOGLOBIN (HGB A1C): Hemoglobin A1C: 4.6 % (ref 4.0–5.6)

## 2023-05-10 MED ORDER — OLMESARTAN-AMLODIPINE-HCTZ 40-10-25 MG PO TABS
ORAL_TABLET | ORAL | 0 refills | Status: DC
Start: 2023-05-10 — End: 2023-05-19

## 2023-05-10 MED ORDER — ZEPBOUND 2.5 MG/0.5ML ~~LOC~~ SOAJ
2.5000 mg | SUBCUTANEOUS | 0 refills | Status: DC
Start: 2023-05-10 — End: 2023-06-19

## 2023-05-10 MED ORDER — METHYLPREDNISOLONE ACETATE 40 MG/ML IJ SUSP
80.0000 mg | Freq: Once | INTRAMUSCULAR | Status: DC
Start: 2023-05-10 — End: 2023-05-10

## 2023-05-10 MED ORDER — METHYLPREDNISOLONE ACETATE 80 MG/ML IJ SUSP
80.0000 mg | Freq: Once | INTRAMUSCULAR | Status: AC
Start: 2023-05-10 — End: 2023-05-10
  Administered 2023-05-10: 80 mg via INTRAMUSCULAR

## 2023-05-10 NOTE — Telephone Encounter (Signed)
Copied from CRM [id number]. Topic: Clinical - Prescription Issue >> May 10, 2023  2:22 PM Desma Mcgregor wrote: Reason for CRM: Pharmacy informed to patient that Dr. Caleen Essex needs to reach out to [facility] insurance regarding tirzepatide (ZEPBOUND) 2.5 MG/0.5ML Pen. There is some information needed, but not sure what. CB# [phone] Randa Evens

## 2023-05-10 NOTE — Progress Notes (Signed)
Established Patient Office Visit  Subjective   Patient ID: Kenneth Duncan, male    DOB: [birth year]  Age: 68 y.o. MRN: [id number]  Chief Complaint  Patient presents with   Dizziness    Right ear pain    HPI Pt is a 68 yo obese male who presents to the clinic with his wife to discuss episode of dizziness and right ear pain and hypertension.   A few days ago patient felt his right ear "pop" and then he got very dizzy for a few hours. He went home and went to bed and woke up with no dizziness. He describes the dizziness as the room spinning. He denies any fever, chills, URI symptoms.   He had episode of different dizziness in October and went to ED. His BP was elevated and started on amlodipine. He has not had that dizziness since. He is not check BP at home.   Patient Active Problem List   Diagnosis Date Noted   Dysfunction of both eustachian tubes 05/10/2023   Class 3 severe obesity due to excess calories with serious comorbidity and body mass index (BMI) of 40.0 to 44.9 in adult [facility]) 05/10/2023   Palpitations 01/20/2023   Uncontrolled hypertension 01/20/2023   Dizziness 01/20/2023   Class 3 severe obesity due to excess calories without serious comorbidity with body mass index (BMI) of 40.0 to 44.9 in adult (HCC) 11/29/2022   Seasonal allergies 10/27/2020   Gastroesophageal reflux disease with esophagitis 10/27/2020   Moderate episode of recurrent major depressive disorder (HCC) 10/27/2020   Primary osteoarthritis of left knee 11/01/2019   Piriformis syndrome of both sides 10/09/2018   Lumbar herniated disc 05/23/2017   Lumbar foraminal stenosis 05/23/2017   Chronic SI joint pain 05/21/2017   Angio-edema 03/07/2017   History of colon polyps 11/01/2016   Elevated LDL cholesterol level 10/28/2015   Vitamin D deficiency 10/28/2015   Benign prostatic hyperplasia 10/21/2015   No energy 10/21/2015   OSA (obstructive sleep apnea) 08/24/2015   Hemorrhoid prolapse 08/15/2013   B12  deficiency 07/28/2012   Herpetiformis dermatitis 07/28/2012   Chronic low back pain 06/14/2011   Degenerative disc disease, lumbar 06/14/2011   Essential hypertension 06/14/2011   GERD (gastroesophageal reflux disease) 06/14/2011   Anxiety disorder due to general medical condition 06/14/2011   Celiac disease 06/14/2011   Past Medical History:  Diagnosis Date   Celiac disease    Degenerative disc disease, lumbar    GERD (gastroesophageal reflux disease)    Hypertension    Past Surgical History:  Procedure Laterality Date   ANTERIOR CERVICAL DECOMP/DISCECTOMY FUSION     APPENDECTOMY     clavicle     distal end removed.   COLONOSCOPY W/ BIOPSIES  10/17/2016   knee arthoscopic surgery      Bilateral menicus tear   Family History  Problem Relation Age of Onset   Uterine cancer Mother    Heart attack Mother 47   Heart attack Father    Diabetes Father    Heart disease Father    Diabetes Sister    Heart disease Sister    Heart attack Sister    Lung cancer Brother    Lymphoma Brother    Uterine cancer Sister    Allergies  Allergen Reactions   Lisinopril Swelling    Angioedema limited to lips   Penicillins     Pt does not remember allergy- told as child      ROS See HPI.    Objective:  BP (!) 150/90   Pulse 64   Temp 98.5 F (36.9 C) (Oral)   Ht 6\' 2"  (1.88 m)   Wt (!) 316 lb (143.3 kg)   SpO2 99%   BMI 40.57 kg/m  BP Readings from Last 3 Encounters:  05/10/23 (!) 150/90  01/20/23 (!) 166/84  01/17/23 (!) 152/103   Wt Readings from Last 3 Encounters:  05/10/23 (!) 316 lb (143.3 kg)  01/20/23 284 lb (128.8 kg)  01/17/23 300 lb (136.1 kg)      Physical Exam Constitutional:      Appearance: Normal appearance. He is obese.  HENT:     Head: Normocephalic.  Cardiovascular:     Rate and Rhythm: Normal rate and regular rhythm.  Pulmonary:     Effort: Pulmonary effort is normal.     Breath sounds: Normal breath sounds.  Neurological:     General:  No focal deficit present.     Mental Status: He is alert and oriented to person, place, and time.     Comments: Negative dix hallpike  Psychiatric:        Mood and Affect: Mood normal.      Results for orders placed or performed in visit on 05/10/23  POCT HgB A1C  Result Value Ref Range   Hemoglobin A1C 4.6 4.0 - 5.6 %   HbA1c POC (<> result, manual entry)     HbA1c, POC (prediabetic range)     HbA1c, POC (controlled diabetic range)        The 10-year ASCVD risk score (Arnett DK, et al., 2019) is: 18.6%    Assessment & Plan:  Marland KitchenMarland KitchenBryn was seen today for dizziness.  Diagnoses and all orders for this visit:  Uncontrolled hypertension -     Olmesartan-amLODIPine-HCTZ 40-10-25 MG TABS; Take one tablet daily.  Vertigo  Diabetes mellitus screening -     POCT HgB A1C  OSA (obstructive sleep apnea) -     tirzepatide (ZEPBOUND) 2.5 MG/0.5ML Pen; Inject 2.5 mg into the skin once a week.  Essential hypertension -     Olmesartan-amLODIPine-HCTZ 40-10-25 MG TABS; Take one tablet daily. -     tirzepatide (ZEPBOUND) 2.5 MG/0.5ML Pen; Inject 2.5 mg into the skin once a week.  Elevated LDL cholesterol level -     tirzepatide (ZEPBOUND) 2.5 MG/0.5ML Pen; Inject 2.5 mg into the skin once a week.  Dysfunction of both eustachian tubes -     Discontinue: methylPREDNISolone acetate (DEPO-MEDROL) injection 80 mg -     methylPREDNISolone acetate (DEPO-MEDROL) injection 80 mg  Class 3 severe obesity due to excess calories with serious comorbidity and body mass index (BMI) of 40.0 to 44.9 in adult (HCC) -     tirzepatide (ZEPBOUND) 2.5 MG/0.5ML Pen; Inject 2.5 mg into the skin once a week.   BP not to goal.  I do not think episode of dizziness was due to BP but rather ETD and vertigo Vertigo has resolved Increased hydrochlorothiazide and amlodipine into combination olmesartan/amlodipine/hydrochlorothiazide tablet Recheck BP in 2 weeks  Depomedrol 80mg  given for ETD No signs of ear  infection Continue flonase  Pt needs to lose weight OSA/HTN/elevated LDL/chronic low back pain Z6X normal, no diabetes Start zepbound Discussed how to use and titrate up Follow up in 3 months  Return in about 2 weeks (around 05/24/2023) for BP check/weight check.    Tandy Gaw, PA-C

## 2023-05-10 NOTE — Patient Instructions (Addendum)
BStart zepbound weekly Start combination pill for blood pressure  Eustachian Tube Dysfunction  Eustachian tube dysfunction refers to a condition in which a blockage develops in the narrow passage that connects the middle ear to the back of the nose (eustachian tube). The eustachian tube regulates air pressure in the middle ear by letting air move between the ear and nose. It also helps to drain fluid from the middle ear space. Eustachian tube dysfunction can affect one or both ears. When the eustachian tube does not function properly, air pressure, fluid, or both can build up in the middle ear. What are the causes? This condition occurs when the eustachian tube becomes blocked or cannot open normally. Common causes of this condition include: Ear infections. Colds and other infections that affect the nose, mouth, and throat (upper respiratory tract). Allergies. Irritation from cigarette smoke. Irritation from stomach acid coming up into the esophagus (gastroesophageal reflux). The esophagus is the part of the body that moves food from the mouth to the stomach. Sudden changes in air pressure, such as from descending in an airplane or scuba diving. Abnormal growths in the nose or throat, such as: Growths that line the nose (nasal polyps). Abnormal growth of cells (tumors). Enlarged tissue at the back of the throat (adenoids). What increases the risk? You are more likely to develop this condition if: You smoke. You are overweight. You are a child who has: Certain birth defects of the mouth, such as cleft palate. Large tonsils or adenoids. What are the signs or symptoms? Common symptoms of this condition include: A feeling of fullness in the ear. Ear pain. Clicking or popping noises in the ear. Ringing in the ear (tinnitus). Hearing loss. Loss of balance. Dizziness. Symptoms may get worse when the air pressure around you changes, such as when you travel to an area of high elevation, fly  on an airplane, or go scuba diving. How is this diagnosed? This condition may be diagnosed based on: Your symptoms. A physical exam of your ears, nose, and throat. Tests, such as those that measure: The movement of your eardrum. Your hearing (audiometry). How is this treated? Treatment depends on the cause and severity of your condition. In mild cases, you may relieve your symptoms by moving air into your ears. This is called "popping the ears." In more severe cases, or if you have symptoms of fluid in your ears, treatment may include: Medicines to relieve congestion (decongestants). Medicines that treat allergies (antihistamines). Nasal sprays or ear drops that contain medicines that reduce swelling (steroids). A procedure to drain the fluid in your eardrum. In this procedure, a small tube may be placed in the eardrum to: Drain the fluid. Restore the air in the middle ear space. A procedure to insert a balloon device through the nose to inflate the opening of the eustachian tube (balloon dilation). Follow these instructions at home: Lifestyle Do not do any of the following until your health care provider approves: Travel to high altitudes. Fly in airplanes. Work in a Estate agent or room. Scuba dive. Do not use any products that contain nicotine or tobacco. These products include cigarettes, chewing tobacco, and vaping devices, such as e-cigarettes. If you need help quitting, ask your health care provider. Keep your ears dry. Wear fitted earplugs during showering and bathing. Dry your ears completely after. General instructions Take over-the-counter and prescription medicines only as told by your health care provider. Use techniques to help pop your ears as recommended by your health care  provider. These may include: Chewing gum. Yawning. Frequent, forceful swallowing. Closing your mouth, holding your nose closed, and gently blowing as if you are trying to blow air out of your  nose. Keep all follow-up visits. This is important. Contact a health care provider if: Your symptoms do not go away after treatment. Your symptoms come back after treatment. You are unable to pop your ears. You have: A fever. Pain in your ear. Pain in your head or neck. Fluid draining from your ear. Your hearing suddenly changes. You become very dizzy. You lose your balance. Get help right away if: You have a sudden, severe increase in any of your symptoms. Summary Eustachian tube dysfunction refers to a condition in which a blockage develops in the eustachian tube. It can be caused by ear infections, allergies, inhaled irritants, or abnormal growths in the nose or throat. Symptoms may include ear pain or fullness, hearing loss, or ringing in the ears. Mild cases are treated with techniques to unblock the ears, such as yawning or chewing gum. More severe cases are treated with medicines or procedures. This information is not intended to replace advice given to you by your health care provider. Make sure you discuss any questions you have with your health care provider. Document Revised: 06/15/2020 Document Reviewed: 06/15/2020 Elsevier Patient Education  2024 [employer].

## 2023-05-12 ENCOUNTER — Encounter: Payer: Self-pay | Admitting: Physician Assistant

## 2023-05-12 DIAGNOSIS — R42 Dizziness and giddiness: Secondary | ICD-10-CM | POA: Insufficient documentation

## 2023-05-19 ENCOUNTER — Telehealth: Payer: Self-pay

## 2023-05-19 ENCOUNTER — Ambulatory Visit: Payer: BC Managed Care – PPO | Attending: Cardiovascular Disease | Admitting: Cardiovascular Disease

## 2023-05-19 ENCOUNTER — Encounter: Payer: Self-pay | Admitting: Cardiovascular Disease

## 2023-05-19 VITALS — BP 98/76 | HR 68 | Ht 74.0 in | Wt 305.0 lb

## 2023-05-19 DIAGNOSIS — G4733 Obstructive sleep apnea (adult) (pediatric): Secondary | ICD-10-CM | POA: Diagnosis not present

## 2023-05-19 DIAGNOSIS — I10 Essential (primary) hypertension: Secondary | ICD-10-CM | POA: Diagnosis not present

## 2023-05-19 DIAGNOSIS — R002 Palpitations: Secondary | ICD-10-CM | POA: Diagnosis not present

## 2023-05-19 DIAGNOSIS — R42 Dizziness and giddiness: Secondary | ICD-10-CM

## 2023-05-19 MED ORDER — POTASSIUM CHLORIDE CRYS ER 20 MEQ PO TBCR: 20.0000 meq | EXTENDED_RELEASE_TABLET | Freq: Every day | ORAL | 3 refills | Status: AC

## 2023-05-19 MED ORDER — AMLODIPINE BESYLATE 5 MG PO TABS: 5.0000 mg | ORAL_TABLET | Freq: Every day | ORAL | 3 refills | Status: DC

## 2023-05-19 MED ORDER — OLMESARTAN MEDOXOMIL 40 MG PO TABS: 40.0000 mg | ORAL_TABLET | Freq: Every day | ORAL | 3 refills | Status: AC

## 2023-05-19 MED ORDER — HYDROCHLOROTHIAZIDE 12.5 MG PO CAPS: 12.5000 mg | ORAL_CAPSULE | Freq: Every day | ORAL | 3 refills | Status: AC

## 2023-05-19 NOTE — Patient Instructions (Signed)
Medication Instructions:  Stop Olmesartan/Amlodipine/hydrochlorothiazide Start Olmesartan 40 mg daily Start Amlodipine 5 mg daily Start Hydrochlorothiazide 12.5 mg daily Start K-dur 20 mg daily *If you need a refill on your cardiac medications before your next appointment, please call your pharmacy*   Lab Work: To be completed in 4 weeks: BMET If you have labs (blood work) drawn today and your tests are completely normal, you will receive your results only by: MyChart Message (if you have MyChart) OR A paper copy in the mail If you have any lab test that is abnormal or we need to change your treatment, we will call you to review the results.   Follow-Up: At [facility], you and your health needs are our priority.  As part of our continuing mission to provide you with exceptional heart care, we have created designated Provider Care Teams.  These Care Teams include your primary Cardiologist (physician) and Advanced Practice Providers (APPs -  Physician Assistants and Nurse Practitioners) who all work together to provide you with the care you need, when you need it.  We recommend signing up for the patient portal called "MyChart".  Sign up information is provided on this After Visit Summary.  MyChart is used to connect with patients for Virtual Visits (Telemedicine).  Patients are able to view lab/test results, encounter notes, upcoming appointments, etc.  Non-urgent messages can be sent to your provider as well.   To learn more about what you can do with MyChart, go to [email or web address].    Your next appointment:   3 month(s)  Provider:   Leodis Sias, MD  Other Instructions You have been referred to Hypertension Clinic. You have been referred to Dr. Mayford Knife for OSA

## 2023-05-19 NOTE — Progress Notes (Signed)
Cardiology Office Note:  .   Date:  05/19/2023  ID:  Kenneth Duncan, DOB Sep 09, 1955, MRN [id number] PCP: Nolene Ebbs  [facility] Providers Cardiologist:  Tessa Lerner, DO    History of Present Illness: .     Seen wit Kenneth Duncan , wife  Kenneth Duncan is a 68 y.o. male with hx of HTN, dizziness, palpitations, left sided chest pain  Hx of celiac disease , morbid obesity  HLD , OSA (doesn't wear his  CPAP, has tried various masks )   CT of the head was unremarkable  Has celicac - still eats lots of bread     Many months of dizzyness  - orthostatic hypotension  Dizziness always with standing  Hydrochlorothiazide was stopped  Combination pill of olmesartan 40 mg-amlodipine 10 mg-HCTZ 25 mg a day  In reviewing his history it appears that he is overmedicated for the most part.  His symptoms are clearly orthostatic hypotension. His wife admits to adding salt to all of the food that she fixes. He really does not get any cardio exercise. I have encouraged him to reduce the salt in his diet.  He needs to get more cardio exercise.  He absolutely needs to get his sleep apnea under control.   If he does not correct these issues then it would be extremely difficult to get his blood pressure controlled.  We have discontinued the combination olmesartan, amlodipine, hydrochlorothiazide  tablet.  Continue olmesartan 40 mg a day Reduce amlodipine to 5 mg a day Reduce HCTZ to 12.5 mg a day. Add potassium chloride 20 mill equivalents a day Will have him see the hypertension pharmacy clinic in 1 week.  Will check a basic metabolic profile at that time. I would have a low threshold to refer him to Dr. Leonides Sake hypertension clinic if were not able to get his blood pressure controlled.  Will refer him to Dr. Mayford Knife for evaluation and options regarding his obstructive sleep apnea.    No cp  Works as a Production designer, theatre/television/film at [employer] some regular exercise - walks on rare  occasion  He says that he tries to avoid salt, his wife admits to adding salt every meal that she fixes.      ROS:   Studies Reviewed: Marland Kitchen   EKG Interpretation Date/Time:  Friday May 19 2023 08:52:58 EST Ventricular Rate:  68 PR Interval:  194 QRS Duration:  98 QT Interval:  420 QTC Calculation: 446 R Axis:   -79  Text Interpretation: Normal sinus rhythm Left axis deviation Inferior infarct , age undetermined When compared with ECG of 17-Jan-2023 14:04, PREVIOUS ECG IS PRESENT Confirmed by Kristeen Miss ([id number]) on 05/19/2023 9:01:57 AM     Risk Assessment/Calculations:             Physical Exam:   VS:  BP 98/76   Pulse 68   Ht 6\' 2"  (1.88 m)   Wt (!) 305 lb (138.3 kg)   SpO2 94%   BMI 39.16 kg/m    Wt Readings from Last 3 Encounters:  05/19/23 (!) 305 lb (138.3 kg)  05/10/23 (!) 316 lb (143.3 kg)  01/20/23 284 lb (128.8 kg)    GEN: morbidly obese male,  no acute distress NECK: No JVD; No carotid bruits CARDIAC: RRR, no murmurs, rubs, gallops RESPIRATORY:  Clear to auscultation without rales, wheezing or rhonchi  ABDOMEN: Soft, non-tender, non-distended EXTREMITIES:  No edema; No deformity   ASSESSMENT AND PLAN: .    HTN:  n reviewing his history it appears that he is overmedicated for the most part.  His symptoms are clearly orthostatic hypotension. His wife admits to adding salt to all of the food that she fixes. He really does not get any cardio exercise. I have encouraged him to reduce the salt in his diet.  He needs to get more cardio exercise.  He absolutely needs to get his sleep apnea under control.   If he does not correct these issues then it would be extremely difficult to get his blood pressure controlled.  We have discontinued the combination olmesartan, amlodipine, hydrochlorothiazide  tablet.  Continue olmesartan 40 mg a day Reduce amlodipine to 5 mg a day Reduce HCTZ to 12.5 mg a day. Add potassium chloride 20 mill equivalents a day Will  have him see the hypertension pharmacy clinic in 1 week.  Will check a basic metabolic profile at that time. I would have a low threshold to refer him to Dr. Leonides Sake hypertension clinic if were not able to get his blood pressure controlled.  Will refer him to Dr. Mayford Knife for evaluation and options regarding his obstructive sleep apnea.   Dispo:     4 weeks with HTN clinic with BMP   Refer to Dr.  Mayford Knife for sleep medicine   I will see him in 3 months     Signed, Kristeen Miss, MD

## 2023-05-19 NOTE — Telephone Encounter (Signed)
Spoke with patient  He states medication combination of  Olm/ aml/ hydrochlorothiazide was stopped by cardiology this morning  He had wanted to know if o.k. to stop  med by his PCP In reading cardiology notes looks like components  were just separated as below: We have discontinued the combination olmesartan, amlodipine, hydrochlorothiazide  tablet.  Continue olmesartan 40 mg a day Reduce amlodipine to 5 mg a day Reduce HCTZ to 12.5 mg a day. Add potassium chloride 20 mill equivalents a day

## 2023-05-19 NOTE — Telephone Encounter (Signed)
Copied from CRM [id number]. Topic: Clinical - Medical Advice >> May 19, 2023 12:12 PM Alvino Blood C wrote: Reason for CRM: Patient is wanting to know if there is something that can be placed under the skin to assist with sleep apnea. Patient was advised by another provider to stop taking the following medication: but he wants to be sure its okay to stop the medication.

## 2023-05-24 NOTE — Telephone Encounter (Signed)
 Attempted call to patient. Left a voice mail message requesting a return call.

## 2023-05-26 ENCOUNTER — Telehealth: Payer: Self-pay

## 2023-05-26 NOTE — Telephone Encounter (Signed)
 Kenneth Duncan (Key: [id number]) PA Case ID #: [id number] Need Help? Call us  at [phone] Outcome Denied today by [facility] 2017 Denied. This health benefit plan does not cover the following services, supplies, drugs or charges: Any treatment or regimen, medical or surgical, for the purpose of reducing or controlling the weight of the member, or for the treatment of obesity, except for surgical treatment of morbid obesity, or as specifically covered by this health benefit plan. Drug Zepbound  2.5MG /0.5ML pen-injectors ePA cloud logo Form Pensions Consultant Request Form

## 2023-05-26 NOTE — Telephone Encounter (Signed)
 PA denial received for Zepbound  plan does not cover any meds for weight loss How would you like to proceed ?   Kenneth Duncan (Key: [id number]) PA Case ID #: [id number] Need Help? Call us  at [phone] Outcome Denied today by [facility] 2017 Denied. This health benefit plan does not cover the following services, supplies, drugs or charges: Any treatment or regimen, medical or surgical, for the purpose of reducing or controlling the weight of the member, or for the treatment of obesity, except for surgical treatment of morbid obesity, or as specifically covered by this health benefit plan. Drug Zepbound  2.5MG /0.5ML pen-injectors ePA cloud logo Form [employer] [place] [employer] Electronic Request Form      Note   You routed conversation to Kenneth Duncan, Kenneth Duncan, CMA1 hour ago (2:14 PM)   You1 hour ago (2:14 PM)    PA initiated for Zepbound  0.25mg  Kenneth Duncan (Key: [id number]) PA Case ID #: [id number] Need Help? Call us  at [phone] Status sent iconSent to Plan today Drug Zepbound  2.5MG /0.5ML pen-injectors ePA cloud logo Form [employer] [place] [employer] Electronic Request Form      Note   You1 hour ago (2:14 PM)    Copied from KEYSPAN [id number]. Topic: Clinical - Prescription Issue >> May 26, 2023  1:44 PM Kenneth Duncan wrote: Reason for CRM: Patient wife called states she has not heard anything back about patient authorization for tirzepatide  (ZEPBOUND ) 2.5 MG/0.5ML Pen. States patient has sleep apnea and it should be approved. Would like someone to give her a call at [phone] to discuss concerns and provide a status update. Request to speak with her because her husband does not understand the medications. Thank You

## 2023-05-26 NOTE — Telephone Encounter (Signed)
 PA initiated for Zepbound  0.25mg  Kenneth Duncan (Key: [id number]) PA Case ID #: [id number] Need Help? Call us  at [phone] Status sent iconSent to Plan today Drug Zepbound  2.5MG /0.5ML pen-injectors ePA cloud logo Form [employer] [place] [employer] Electronic Request Form

## 2023-05-26 NOTE — Telephone Encounter (Signed)
 Copied from CRM [id number]. Topic: Clinical - Prescription Issue >> May 26, 2023  1:44 PM Graeme ORN wrote: Reason for CRM: Patient wife called states she has not heard anything back about patient authorization for tirzepatide  (ZEPBOUND ) 2.5 MG/0.5ML Pen. States patient has sleep apnea and it should be approved. Would like someone to give her a call at [phone] to discuss concerns and provide a status update. Request to speak with her because her husband does not understand the medications. Thank You

## 2023-05-27 ENCOUNTER — Other Ambulatory Visit: Payer: Self-pay | Admitting: Physician Assistant

## 2023-05-27 DIAGNOSIS — I10 Essential (primary) hypertension: Secondary | ICD-10-CM

## 2023-05-29 ENCOUNTER — Telehealth: Payer: BC Managed Care – PPO | Admitting: Physician Assistant

## 2023-05-31 ENCOUNTER — Telehealth: Payer: BC Managed Care – PPO | Admitting: Physician Assistant

## 2023-05-31 NOTE — Telephone Encounter (Signed)
Prior auth for: ZEPBOUND 2.5 MG Determination: Pending as of 05/31/23 Auth #: [id number] Valid from: n/a Patient notified via MyChart

## 2023-06-07 ENCOUNTER — Ambulatory Visit: Payer: Self-pay | Admitting: Physician Assistant

## 2023-06-07 NOTE — Telephone Encounter (Signed)
 Prior auth for: ZEPBOUND 2.5 MG Determination: DENIED Auth #: [id number] Reason: Plan exclusion. The drug is not covered by the insurance.  Patient notified via MyChart

## 2023-06-07 NOTE — Telephone Encounter (Signed)
 2nd attempt, left voicemail for patient to return call for triage.  Copied from CRM [id number]. Topic: Clinical - Medical Advice >> Jun 07, 2023 11:21 AM Shon Hale wrote: Reason for CRM: Pt's wife believes he has a head cold, has had more congestion in nasal passages. Pt is out of work at the moment. Wife denies fevers. Wife inquiring if mucinex is okay for patient to take with other medications.    Please assist pt further

## 2023-06-07 NOTE — Telephone Encounter (Signed)
 3rd attempt. Left voicemail for patient to call back for triage. Routed to PCP clinical.  Copied from CRM [id number]. Topic: Clinical - Medical Advice >> Jun 07, 2023 11:21 AM Shon Hale wrote: Reason for CRM: Pt's wife believes he has a head cold, has had more congestion in nasal passages. Pt is out of work at the moment. Wife denies fevers. Wife inquiring if mucinex is okay for patient to take with other medications.    Please assist pt further

## 2023-06-07 NOTE — Telephone Encounter (Signed)
 This RN made first attempt to triage patient. No answer, unable to leave message. Will continue to attempt.  Copied from CRM [id number]. Topic: Clinical - Medical Advice >> Jun 07, 2023 11:21 AM Shon Hale wrote: Reason for CRM: Pt's wife believes he has a head cold, has had more congestion in nasal passages. Pt is out of work at the moment. Wife denies fevers. Wife inquiring if mucinex is okay for patient to take with other medications.   Please assist pt further

## 2023-06-12 ENCOUNTER — Telehealth: Payer: Self-pay | Admitting: Physician Assistant

## 2023-06-12 NOTE — Telephone Encounter (Signed)
 Copied from CRM [id number]. Topic: Clinical - Prescription Issue >> Jun 12, 2023 10:57 AM Clayton Bibles wrote: Reason for CRM:  tirzepatide (ZEPBOUND) 2.5 MG/0.5ML Pen Please send in prior authorization to [employer]; If medication gets denied, please appeal and so a peer to peer

## 2023-06-19 ENCOUNTER — Ambulatory Visit: Payer: Self-pay | Admitting: Physician Assistant

## 2023-06-19 ENCOUNTER — Telehealth: Payer: Self-pay

## 2023-06-19 ENCOUNTER — Ambulatory Visit: Admitting: Physician Assistant

## 2023-06-19 VITALS — BP 127/82 | HR 75 | Ht 74.0 in | Wt 315.0 lb

## 2023-06-19 DIAGNOSIS — K645 Perianal venous thrombosis: Secondary | ICD-10-CM | POA: Insufficient documentation

## 2023-06-19 MED ORDER — HYDROCODONE-ACETAMINOPHEN 5-325 MG PO TABS: 1.0000 | ORAL_TABLET | Freq: Three times a day (TID) | ORAL | 0 refills | Status: AC | PRN

## 2023-06-19 MED ORDER — HYDROCORTISONE (PERIANAL) 2.5 % EX CREA: 1.0000 | TOPICAL_CREAM | Freq: Two times a day (BID) | CUTANEOUS | 1 refills | Status: AC

## 2023-06-19 NOTE — Progress Notes (Signed)
 Acute Office Visit  Subjective:     Patient ID: Kenneth Duncan, male    DOB: 1956-01-29, 68 y.o.   MRN: [id number]  CC: hemorrhoids   HPI Patient is a 68 yo male who presents with a painful hemorrhoid x1 week. He has history of these but has not had one in a long time.  He endorses constipation, straining, and sitting for prolonged periods of time.   .. Active Ambulatory Problems    Diagnosis Date Noted   Chronic low back pain 06/14/2011   Degenerative disc disease, lumbar 06/14/2011   Essential hypertension 06/14/2011   GERD (gastroesophageal reflux disease) 06/14/2011   Anxiety disorder due to general medical condition 06/14/2011   Celiac disease 06/14/2011   B12 deficiency 07/28/2012   Herpetiformis dermatitis 07/28/2012   Hemorrhoid prolapse 08/15/2013   OSA (obstructive sleep apnea) 08/24/2015   Benign prostatic hyperplasia 10/21/2015   No energy 10/21/2015   Elevated LDL cholesterol level 10/28/2015   Vitamin D deficiency 10/28/2015   History of colon polyps 11/01/2016   Angio-edema 03/07/2017   Chronic SI joint pain 05/21/2017   Lumbar herniated disc 05/23/2017   Lumbar foraminal stenosis 05/23/2017   Piriformis syndrome of both sides 10/09/2018   Primary osteoarthritis of left knee 11/01/2019   Seasonal allergies 10/27/2020   Gastroesophageal reflux disease with esophagitis 10/27/2020   Moderate episode of recurrent major depressive disorder (HCC) 10/27/2020   Class 3 severe obesity due to excess calories without serious comorbidity with body mass index (BMI) of 40.0 to 44.9 in adult [facility]) 11/29/2022   Palpitations 01/20/2023   Uncontrolled hypertension 01/20/2023   Dizziness 01/20/2023   Dysfunction of both eustachian tubes 05/10/2023   Class 3 severe obesity due to excess calories with serious comorbidity and body mass index (BMI) of 40.0 to 44.9 in adult [facility]) 05/10/2023   Vertigo 05/12/2023   Thrombosed external hemorrhoid 06/19/2023   Resolved  Ambulatory Problems    Diagnosis Date Noted   No Resolved Ambulatory Problems   Past Medical History:  Diagnosis Date   Hypertension      Review of Systems  Genitourinary:        Painful hemorrhoid  All other systems reviewed and are negative.     Objective:    BP 127/82 (BP Location: Left Arm, Patient Position: Sitting, Cuff Size: Large)   Pulse 75   Ht 6\' 2"  (1.88 m)   Wt (!) 315 lb (142.9 kg)   SpO2 93%   BMI 40.44 kg/m  BP Readings from Last 3 Encounters:  06/19/23 127/82  05/19/23 98/76  05/10/23 (!) 150/90   Wt Readings from Last 3 Encounters:  06/19/23 (!) 315 lb (142.9 kg)  05/19/23 (!) 305 lb (138.3 kg)  05/10/23 (!) 316 lb (143.3 kg)   SpO2 Readings from Last 3 Encounters:  06/19/23 93%  05/19/23 94%  05/10/23 99%   Physical Exam Constitutional:      Appearance: He is obese.  HENT:     Head: Normocephalic and atraumatic.  Eyes:     Extraocular Movements: Extraocular movements intact.  Cardiovascular:     Rate and Rhythm: Normal rate and regular rhythm.     Pulses: Normal pulses.     Heart sounds: Normal heart sounds.  Pulmonary:     Effort: Pulmonary effort is normal.     Breath sounds: Normal breath sounds.  Abdominal:     General: Abdomen is flat.  Genitourinary:    Comments: External hemorrhoid  Musculoskeletal:  General: Normal range of motion.     Cervical back: Normal range of motion.  Neurological:     Mental Status: He is alert.  Psychiatric:        Mood and Affect: Mood normal.      Incision Thrombosed Hemorrhoid Procedure Note  Pre-operative Diagnosis: Thrombosed external hemorrhoid  Post-operative Diagnosis: Thrombosed external hemorrhoid  Locations:3oclock external anus  Indications: Painful external hemorrhoid. Explained surgical vs conservative treatment; surgical incision and drainage of clot usually works quickly to reduce pain and shorten healing time, but is uncomfortable to perform.  After discussion, the  patient wishes to proceed with this procedure.  Anesthesia: Lidocaine 1% without epinephrine without added sodium bicarbonate  Procedure Details  After adequate anesthesia, an elliptical incision was made, and the visible clot was extruded with curved hemostat. This was well tolerated. Bulky dressing is applied.  Complications: none.  Plan: 1. Very frequent Sitz baths. 2. Colace tid prn and increase fluids to prevent constipation.   3. Call or return to clinic prn if these symptoms worsen or fail to improve as anticipated  Assessment & Plan:  Marland KitchenMarland KitchenGabrial was seen today for hemorrhoids.  Diagnoses and all orders for this visit:  Thrombosed external hemorrhoid -     hydrocortisone (ANUSOL-HC) 2.5 % rectal cream; Place 1 Application rectally 2 (two) times daily. -     HYDROcodone-acetaminophen (NORCO/VICODIN) 5-325 MG tablet; Take 1 tablet by mouth every 8 (eight) hours as needed for up to 5 days for moderate pain (pain score 4-6).   - Sitz baths at least 1-2 times daily  - Patient educated on watching bowels and making sure he doesn't strain  - use colace - Warm soapy water to clean the area and witch hazel to decrease swelling - anusol given to use as needed  - small quantity of norco given for pain control as needed .Marland KitchenPDMP reviewed during this encounter. No concerns   Tandy Gaw, PA-C

## 2023-06-19 NOTE — Telephone Encounter (Signed)
 Copied from CRM [id number]. Topic: Clinical - Medical Advice >> Jun 19, 2023  9:11 AM [name] wrote: Reason for CRM: Patient wants PCP to call in a prescription for a hemorrhoid without being seen. Patient's spouse refuses to schedule an appointment. Wants immediate prescription of a medication or cream to deal with the issue without an appointment.   Attempted to advise patient they will need an appointment before they can be prescribed but patient's spouse insists she wants to speak to PCP to demand the prescription without evaluation.

## 2023-06-19 NOTE — Telephone Encounter (Signed)
  Chief Complaint: rectal pain Symptoms: rectal pain w/ hemorrhoid Frequency: a week, but hemorrhoid is an ongoing issue Pertinent Negatives: Patient denies constipation, diarrhea, abdominal pain, bleeding Disposition: [] ED /[] Urgent Care (no appt availability in office) / [x] Appointment(In office/virtual)/ []  [place] [facility]/ [] Home Care/ [] Refused Recommended Disposition /[] [place] [facility]/ []  Follow-up with PCP Additional Notes: Wife calls in for pt while pt is at work. Wife reports pt has had severe rectal pain d/t a hemorrhoid for a week. Wife states sitz baths are usually helpful but they moved recently and are unable to locate it. Denies fever, rectal bleeding, abdominal pain. Pt states his bowel movements are regular. Per protocol RN scheduled pt for 2:40pm today in the office. Wife agreeable and states she will make sure the pt attends. RN advised wife to call us back if the pt's symptoms change or worsen and she verbalized understanding.    Copied from CRM [id number]. Topic: Clinical - Red Word Triage >> Jun 19, 2023  9:20 AM [name] wrote: Red Word that prompted transfer to Nurse Triage: Patient seeking medical advice regarding painsput spot near backside - wants prescription without an appointment. Reason for Disposition  MODERATE-SEVERE rectal pain (i.e., interferes with school, work, or sleep)  Answer Assessment - Initial Assessment Questions 1. SYMPTOM:  "What's the main symptom you're concerned about?" (e.g., pain, itching, swelling, rash)     Hemorrhoid 2. ONSET: "When did the pain start?"     A week 3. RECTAL PAIN: "Do you have any pain around your rectum?" "How bad is the pain?"  (Scale 0-10; or mild, moderate, severe)   - NONE (0): no pain   - MILD (1-3): doesn't interfere with normal activities    - MODERATE (4-7): interferes with normal activities or awakens from sleep, limping    - SEVERE (8-10): excruciating pain, unable to have a bowel movement       "Unbelievable painful", "can't put it back in but it doesn't stay", 10/10 pain per wife  4. RECTAL ITCHING: "Do you have any itching in this area?" "How bad is the itching?"  (Scale 0-10; or mild, moderate, severe)   - NONE: no itching   - MILD: doesn't interfere with normal activities    - MODERATE-SEVERE: interferes with normal activities or awakens from sleep     No 5. CONSTIPATION: "Do you have constipation?" If Yes, ask: "How bad is it?"     Regular bowel movements - Celiac disease 6. CAUSE: "What do you think is causing the anus symptoms?"     Hemorrhoid 7. OTHER SYMPTOMS: "Do you have any other symptoms?"  (e.g., abdomen pain, fever, rectal bleeding, vomiting)     No abdominal pain, no rectal bleeding, no fever, no vomiting  Protocols used: Rectal Symptoms-A-AH

## 2023-06-19 NOTE — Telephone Encounter (Signed)
 Red Word that prompted transfer to Nurse Triage: Patient seeking medical advice regarding painsput spot near backside - wants prescription without an appointment.               Call dropped as agent attempting to transfer. Called pt. Back, busy signal. Unable to leave a message.

## 2023-06-19 NOTE — Patient Instructions (Signed)
 Hemorrhoids Hemorrhoids are swollen veins in and around the rectum or the opening of the butt (anus). There are two types of hemorrhoids: Internal. These occur in the veins just inside the rectum. They may poke through to the outside and become irritated and painful. External. These occur in the veins outside the anus. They can be felt as a painful swelling or hard lump near the anus. Most hemorrhoids do not cause severe problems. Often, they can be treated at home with diet and lifestyle changes. If home treatments do not help, you may need a procedure to shrink or remove the hemorrhoids. What are the causes? Hemorrhoids are caused by pressure near the anus. This pressure may be caused by: Constipation or diarrhea. Straining to poop. Pregnancy. Obesity. Sitting or riding a bike for a long time. Heavy lifting or other things that cause you to strain. Anal sex. What are the signs or symptoms? Symptoms of this condition include: Pain. Anal itching or irritation. Bleeding from the rectum. Leakage of poop (stool). Swelling of the anus. One or more lumps around the anus. How is this diagnosed? Hemorrhoids can often be diagnosed through a visual exam. Other exams or tests may also be done, such as: A digital rectal exam. This is when your health care provider feels inside your rectum with a gloved finger. Anoscope. This is an exam of the anus using a small tube. A blood test, if you have lost a lot of blood. A sigmoidoscopy or colonoscopy. These are tests to look inside the colon using a tube with a camera on the end. How is this treated? In most cases, hemorrhoids can be treated at home with diet and lifestyle changes. If these changes do not help, you may need to have a procedure done. These procedures can make the hemorrhoids smaller or fully remove them. Common procedures include: Rubber band ligation. Rubber bands are placed at the base of the hemorrhoids to cut off their blood  supply. Sclerotherapy. Medicine is put into the hemorrhoids to shrink them. Infrared coagulation. A type of light energy is used to get rid of the hemorrhoids. Hemorrhoidectomy surgery. The hemorrhoids are removed during surgery. Then, the veins that supply them are tied off. Stapled hemorrhoidopexy surgery. The base of the hemorrhoid is stapled to the wall of the rectum. Follow these instructions at home: Medicines Take over-the-counter and prescription medicines only as told by your provider. Use medicated creams or medicines that are put in the rectum (suppositories) as told by your provider. Eating and drinking  Eat foods that are high in fiber, such as beans, whole grains, and fresh fruits and vegetables. Ask your provider about taking products that have fiber added to them (fiber supplements). Reduce the amount of fat in your diet. You can do this by eating low-fat dairy products, eating less red meat, and avoiding processed foods. Drink enough fluid to keep your pee (urine) pale yellow. Managing pain and swelling  Take warm sitz baths for 20 minutes, 3-4 times a day. This can help ease pain and discomfort. You may do this in a bathtub or you can use a portable sitz bath that fits over the toilet. If told, put ice on the affected area. It may help to use ice packs between sitz baths. Put ice in a plastic bag. Place a towel between your skin and the bag. Leave the ice on for 20 minutes, 2-3 times a day. If your skin turns bright red, remove the ice right away to prevent  skin damage. The risk of damage is higher if you cannot feel pain, heat, or cold. General instructions Exercise. Ask your provider how much and what kind of exercise is best for you. In general, you should do moderate exercise for at least 30 minutes on most days of the week (150 minutes each week). You may want to try walking, biking, or yoga. Go to the bathroom when you have the urge to poop. Do not wait. Avoid  straining to poop. Keep the anus dry and clean. Use wet toilet paper or moist towelettes after you poop. Do not sit on the toilet for a long time. This can increase blood pooling and pain. Where to find more information [employer] of Diabetes and Digestive and Kidney Diseases: [email or web address] Contact a health care provider if: You have more pain and swelling that do not get better with treatment. You have trouble pooping or you are not able to poop. You have pain or inflammation outside the area of the hemorrhoids. Get help right away if: You are bleeding from your rectum and you cannot get it to stop. This information is not intended to replace advice given to you by your health care provider. Make sure you discuss any questions you have with your health care provider. Document Revised: 12/15/2021 Document Reviewed: 12/15/2021 Elsevier Patient Education  2024 [employer].

## 2023-06-20 ENCOUNTER — Encounter: Payer: Self-pay | Admitting: Physician Assistant

## 2023-06-20 NOTE — Telephone Encounter (Signed)
 Copied from CRM [id number]. Topic: Clinical - Medical Advice >> Jun 20, 2023 11:55 AM [name] wrote: Reason for CRM: Patient's spouse is calling in to state that following the procedure done, patient's hemorrhoid is back. Patient doe snot want to have it removed again but wanted to notify PCP.

## 2023-06-21 ENCOUNTER — Telehealth: Payer: Self-pay

## 2023-06-21 NOTE — Telephone Encounter (Signed)
 Copied from CRM [id number]. Topic: Clinical - Medical Advice >> Jun 20, 2023 11:55 AM [name] wrote: Reason for CRM: Patient's spouse is calling in to state that following the procedure done, patient's hemorrhoid is back. Patient doe snot want to have it removed again but wanted to notify PCP.

## 2023-06-21 NOTE — Telephone Encounter (Signed)
 Separate message regarding this sent to PCP for review.

## 2023-06-21 NOTE — Telephone Encounter (Signed)
 Spoke to patient wife Kenneth Duncan that  patient was seen for treatment for external hemorrhoid yesterday.  She states patient told her today that things did not improve. States he feels like site is back to previous size and is bleeding and painful.  He is at work today( working in pain)  but once he gets home  this afternoon he will do a sitz bath and will take Tylenol as he prefers not to use pain medication unless absolutely necessary.  She would like to know any suggestions that Kenneth Duncan might have regarding this.  Requesting a call back today if possible.

## 2023-06-23 ENCOUNTER — Telehealth: Payer: Self-pay | Admitting: Physician Assistant

## 2023-06-23 NOTE — Telephone Encounter (Signed)
 [employer] agent called. Checking to see if peer to peer has been done since Zepbound has been denied by insurance.

## 2023-06-28 NOTE — Telephone Encounter (Signed)
 Kenneth Duncan (Key: [id number]) PA Case ID #: [id number] Need Help? Call us at [phone] Outcome Denied on February 7 by [facility] 2017 Denied. This health benefit plan does not cover the following services, supplies, drugs or charges: Any treatment or regimen, medical or surgical, for the purpose of reducing or controlling the weight of the member, or for the treatment of obesity, except for surgical treatment of morbid obesity, or as specifically covered by this health benefit plan. Drug Zepbound 2.5MG /0.5ML pen-injectors ePA cloud logo Form [employer] Request Form

## 2023-06-29 ENCOUNTER — Telehealth: Payer: Self-pay

## 2023-06-29 NOTE — Telephone Encounter (Signed)
 Copied from CRM [id number]. Topic: Clinical - Prescription Issue >> Jun 27, 2023  9:07 AM [name] wrote: Reason for CRM: Patient's spouse is calling in upset and talking over agent consistently to demand to know why the PA for his medicine is not completed yet. Attempted to inform patient that PA is in the process of determining if an appeal can be done. Patient does not voice understanding and is repetitively demanding that she be called and updated every step of the process for the PA going forward.  Patient's spouse states she has cancelled her own treatments and screenings until she hears consistent answers regarding this PA.

## 2023-06-29 NOTE — Telephone Encounter (Addendum)
 This is a direct denial from the insurance on 05/26/23. A MyChart msg was sent to the patient. The patient requested an appeal to be done. An appeal was completed directly with [employer]. Case id# [id number]. The case is pending a medical review to reverse the denial determination. No estimated time period was provided by the [facility] agent. Clinical notes submitted to the appeals dept via fax at [phone].

## 2023-06-29 NOTE — Telephone Encounter (Signed)
 An appeal was completed directly with [employer]. Case id# [id number]. The case is pending a medical review to reverse the denial determination. No estimated time period was provided by the [facility] agent.    Per patient's request - an appeal was submitted for the Zepbound 2.5 mg rx on 06/07/23. Another request was resubmitted to expedite the re-consideration on 06/29/23 to [phone] after direct call with insurance. Fax transmission was successful.

## 2023-06-30 NOTE — Telephone Encounter (Signed)
 Per patient's request - an appeal was submitted for the Zepbound 2.5 mg rx on 06/07/23. Case #[id number]. Another request was resubmitted to expedite the re-consideration on 06/29/23 to [phone]. Fax transmission was successful.

## 2023-07-07 ENCOUNTER — Ambulatory Visit: Payer: BC Managed Care – PPO

## 2023-07-10 ENCOUNTER — Other Ambulatory Visit: Payer: Self-pay | Admitting: Physician Assistant

## 2023-07-10 DIAGNOSIS — I10 Essential (primary) hypertension: Secondary | ICD-10-CM

## 2023-07-10 NOTE — Telephone Encounter (Signed)
 Copied from CRM [id number]. Topic: Clinical - Medication Refill >> Jul 10, 2023  5:01 PM Shon Hale wrote: Most Recent Primary Care Visit:  Provider: Jomarie Longs  Department: [facility]  Visit Type: ACUTE  Date: 06/19/2023  Medication: olmesartan (BENICAR) 40 MG tablet - 90 Day Supply  Patient no longer seeing Dr. Elease Hashimoto and is not going back to see him and requesting Jade send rx.   Has the patient contacted their pharmacy? Yes Was told rx refill was denied.   Is this the correct pharmacy for this prescription? Yes This is the patient's preferred pharmacy:  [id number] - [place], [place] - [address] [place] [address] Phone: [phone] Fax: [phone]  Has the prescription been filled recently? No  Is the patient out of the medication? No  Has the patient been seen for an appointment in the last year OR does the patient have an upcoming appointment? Yes  Can we respond through MyChart? No, requesting phone call, [phone]  Agent: Please be advised that Rx refills may take up to 3 business days. We ask that you follow-up with your pharmacy.

## 2023-07-13 ENCOUNTER — Other Ambulatory Visit: Payer: Self-pay | Admitting: Physician Assistant

## 2023-07-13 DIAGNOSIS — E559 Vitamin D deficiency, unspecified: Secondary | ICD-10-CM

## 2023-07-13 DIAGNOSIS — F331 Major depressive disorder, recurrent, moderate: Secondary | ICD-10-CM

## 2023-07-13 NOTE — Telephone Encounter (Signed)
 Copied from CRM [id number]. Topic: Clinical - Medication Refill >> Jul 13, 2023  8:25 AM Carlatta H wrote: Most Recent Primary Care Visit:  Provider: Jomarie Longs  Department: [facility]  Visit Type: ACUTE  Date: 06/19/2023  Medication:  omeprazole (PRILOSEC) 40 MG capsule [[id number]] olmesartan (BENICAR) 40 MG tablet [[id number]] escitalopram (LEXAPRO) 20 MG tablet [[id number]] Vitamin D, Ergocalciferol, (DRISDOL) 1.25 MG (50000 UNIT) CAPS capsule [[id number]]      Has the patient contacted their pharmacy? Yes (Agent: If no, request that the patient contact the pharmacy for the refill. If patient does not wish to contact the pharmacy document the reason why and proceed with request.) (Agent: If yes, when and what did the pharmacy advise?)   Is this the correct pharmacy for this prescription? Yes If no, delete pharmacy and type the correct one.  This is the patient's preferred pharmacy:    [id number] [place] - [address] [place] [address] Phone: [phone] Fax: [phone]     Has the prescription been filled recently? No  Is the patient out of the medication? Yes  Has the patient been seen for an appointment in the last year OR does the patient have an upcoming appointment? Yes  Can we respond through MyChart? Yes  Agent: Please be advised that Rx refills may take up to 3 business days. We ask that you follow-up with your pharmacy.

## 2023-07-24 ENCOUNTER — Other Ambulatory Visit (HOSPITAL_COMMUNITY): Payer: Self-pay

## 2023-08-18 ENCOUNTER — Ambulatory Visit: Payer: BC Managed Care – PPO | Admitting: Cardiovascular Disease

## 2023-09-04 ENCOUNTER — Other Ambulatory Visit: Payer: Self-pay | Admitting: Physician Assistant

## 2023-09-04 DIAGNOSIS — I10 Essential (primary) hypertension: Secondary | ICD-10-CM

## 2023-09-08 ENCOUNTER — Telehealth: Payer: Self-pay

## 2023-09-08 NOTE — Telephone Encounter (Signed)
 Copied from CRM [id number]. Topic: Clinical - Prescription Issue >> Sep 08, 2023 12:30 PM Blair Bumpers wrote: Reason for CRM: Patient's wife, Devonte Migues, called in stating that she has been trying to get her husband on Zepbound  for over 6 months. States she spoke with [employer], [place], and she was told that the appeal that Dr. Lindaann Requena completed was denied because it wasn't done correctly. Mia Adam states that [employer] advised her to call and let Dr. Lindaann Requena know that she needs to give a call to the [facility] Prior Review/Certification dept at [phone] & press option 3. Dr. Lindaann Requena needs to tell them that she was just notified that patient was denied & she needs to ask what was incorrectly done. Mia Adam would like a call back by Tuesday because she states she has been trying to get this taken care of for too long. Her call back number is [phone].

## 2023-09-08 NOTE — Telephone Encounter (Signed)
 Is this something that the prior authorization team can assist with?

## 2023-09-12 ENCOUNTER — Other Ambulatory Visit (HOSPITAL_COMMUNITY): Payer: Self-pay

## 2023-09-12 NOTE — Telephone Encounter (Signed)
 Spoke with [employer] Kenneth Duncan)  was told that the missing part of the PA  appeal was a signed consent form that was mailed to the patient's home for signature and return.  She states this was never received thus the PA appeal can not be processed until it is received. She states that she will fax this form to our office and we can have the patient come by the office to sign the form and then the appeal can proceed.   Attempted to call patient's wife Kenneth Duncan - left a voice mail message requesting a return call.

## 2023-09-13 NOTE — Telephone Encounter (Signed)
 I have indexed the Member Appeal Representation Authorization form to the media tab.

## 2023-09-13 NOTE — Telephone Encounter (Signed)
 Attempted call to patient's wife Kenneth Duncan- left a voice mail message requesting a return call.

## 2023-09-14 NOTE — Telephone Encounter (Signed)
 Attemtped call to both listed home and mobile # - left detailed voice mail message on listed mobile # - requesting a return call.

## 2023-09-18 NOTE — Telephone Encounter (Signed)
 Called- left a voice mail message detailed ( allowed on DPR ) left return contact number as well.

## 2023-09-19 ENCOUNTER — Telehealth: Payer: Self-pay

## 2023-09-19 ENCOUNTER — Telehealth: Payer: Self-pay | Admitting: Physician Assistant

## 2023-09-19 NOTE — Telephone Encounter (Signed)
 Spoke with Gaylin Ke Haak - informed and will have Joann stop by the office to pick up the paperwork for his signature and then will either fax it himself or return to our office to be faxed.

## 2023-09-19 NOTE — Telephone Encounter (Signed)
 Spoke with patient.

## 2023-09-19 NOTE — Telephone Encounter (Signed)
 Copied from CRM [id number]. Topic: General - Other >> Sep 19, 2023 11:25 AM Bearl Botts A wrote: Reason for CRM: Patients wife states that she will come tomorrow to sign the paper work. She has already spoken with the nurse. >> Sep 19, 2023 11:34 AM Retta Caster wrote: Patient wife Joann calling for Burdette Carolin the nurse. Called office then transferred

## 2023-09-19 NOTE — Telephone Encounter (Signed)
 Copied from CRM [id number]. Topic: Complaint (DO NOT CONVERT) - Care >> Sep 19, 2023 11:55 AM Tiffany H wrote: Date of Incident: 09/09/23 Details of complaint: Patient's wife is discouraged about the year-long process of having husband's Zepbound . Assistance expediting prior authorization process. Patient's weight is having a significant impact on patient's mental health and patient's wife's mental health. Wife advised that every aspect of patient's life is impacted by his inability to secure a Zepbound  prescription. Patient lays on the sofa when patient is not working. Patient is in constant pain. Patient is depressed.  How would the patient like to see it resolved? They would like some help cutting through the red tape to assist with securing Zepbound . Patient has had to start the PA process over.  On a scale of 1-10, how was your experience? 3 - She loves the care team. She's upset about not knowing what else she can do to move this process forward.  What would it take to bring it to a 10? Patient would like help - please call patient when the paperwork is available. She would like to be promptly notified when a process is stalled so that she can assist.   Route to Research officer, political party.

## 2023-09-26 NOTE — Telephone Encounter (Signed)
 Vanicia, can you please assist with patient below? See that was was denied and we appealed the decision.

## 2023-09-26 NOTE — Telephone Encounter (Signed)
 Can the rx auth team assist with this request. Multiple authorizations and appeals were sent, per patient's request. Zepbound  rx was denied by the insurance. Please review all the previous telephone encounters regarding Zepbound . Thank you in advance for any insight/help.

## 2023-09-28 ENCOUNTER — Other Ambulatory Visit (HOSPITAL_COMMUNITY): Payer: Self-pay

## 2023-09-28 ENCOUNTER — Telehealth: Payer: Self-pay

## 2023-09-28 NOTE — Telephone Encounter (Signed)
 Please review additional TE from CPhT Natalie D. Thanks in advance.

## 2023-09-28 NOTE — Telephone Encounter (Signed)
 PA denied for Zepbound  with diagnosis of sleep apenea. Kenneth Duncan was also denied prior to trying auth for Zepbound .

## 2023-09-28 NOTE — Telephone Encounter (Signed)
 Placed a call to [employer] [place] to check to see if Zepbound  would covered with a diagnosis of sleep apnea.  Per the representative, weight loss meds, such as Wegovy and Zebpound, are not covered under the plan and are a plan exclusion.   Phone# [phone]

## 2023-10-16 ENCOUNTER — Other Ambulatory Visit: Payer: Self-pay | Admitting: Physician Assistant

## 2023-10-16 DIAGNOSIS — I10 Essential (primary) hypertension: Secondary | ICD-10-CM

## 2023-10-17 ENCOUNTER — Other Ambulatory Visit: Payer: Self-pay | Admitting: Physician Assistant

## 2023-10-17 DIAGNOSIS — E559 Vitamin D deficiency, unspecified: Secondary | ICD-10-CM

## 2023-10-23 ENCOUNTER — Other Ambulatory Visit: Payer: Self-pay | Admitting: Physician Assistant

## 2023-10-23 DIAGNOSIS — J302 Other seasonal allergic rhinitis: Secondary | ICD-10-CM

## 2023-10-23 NOTE — Telephone Encounter (Unsigned)
 Copied from CRM [id number]. Topic: Clinical - Medication Refill >> Oct 23, 2023  3:44 PM [name] ORN wrote: Medication: fluticasone  (FLONASE ) 50 MCG/ACT nasal spray & Voltaren (Diclofenac) cream  Has the patient contacted their pharmacy? No (Agent: If no, request that the patient contact the pharmacy for the refill. If patient does not wish to contact the pharmacy document the reason why and proceed with request.) (Agent: If yes, when and what did the pharmacy advise?)  This is the patient's preferred pharmacy:   [id number] [place] - [address] [place] [address] Phone: [phone] Fax: [phone]  Is this the correct pharmacy for this prescription? Yes If no, delete pharmacy and type the correct one.   Has the prescription been filled recently? Yes  Is the patient out of the medication? Yes  Has the patient been seen for an appointment in the last year OR does the patient have an upcoming appointment? Yes  Can we respond through MyChart? Yes  Agent: Please be advised that Rx refills may take up to 3 business days. We ask that you follow-up with your pharmacy.

## 2023-10-24 ENCOUNTER — Ambulatory Visit: Payer: Self-pay

## 2023-10-24 ENCOUNTER — Other Ambulatory Visit: Payer: Self-pay | Admitting: Physician Assistant

## 2023-10-24 NOTE — Telephone Encounter (Signed)
  FYI Only or Action Required?: Action required by provider: states he has been on it for years and would like rx for it because insurance will cover it.  Patient was last seen in primary care on 06/19/2023 by Antoniette Vermell CROME, PA-C.  Called Nurse Triage reporting No chief complaint on file..  Symptoms began today.  Interventions attempted: Nothing.  Symptoms are: stable.  Triage Disposition: No disposition on file.  Patient/caregiver understands and will follow disposition?:          Copied from CRM [id number]. Topic: Clinical - Medication Question >> Oct 24, 2023  3:27 PM Kevelyn M wrote: Reason for CRM: Patient's wife is requesting 800 mg for ibuprofen . Call back #[phone]

## 2023-10-24 NOTE — Telephone Encounter (Unsigned)
 Copied from CRM [id number]. Topic: Clinical - Medication Refill >> Oct 24, 2023  4:40 PM Kevelyn M wrote: Medication: omeprazole  (PRILOSEC) 40 MG capsule  Has the patient contacted their pharmacy? No (Agent: If no, request that the patient contact the pharmacy for the refill. If patient does not wish to contact the pharmacy document the reason why and proceed with request.) (Agent: If yes, when and what did the pharmacy advise?)  This is the patient's preferred pharmacy:  [facility] [facility] - [place], [place] - [address] [place] [address] Phone: [phone] Fax: [phone]  [id number] [place] [place] - [address] [place] [address] Phone: [phone] Fax: [phone]  Is this the correct pharmacy for this prescription? Yes If no, delete pharmacy and type the correct one.   Has the prescription been filled recently? No  Is the patient out of the medication? No  Has the patient been seen for an appointment in the last year OR does the patient have an upcoming appointment? Yes  Can we respond through MyChart? Yes  Agent: Please be advised that Rx refills may take up to 3 business days. We ask that you follow-up with your pharmacy.

## 2023-10-26 MED ORDER — FLUTICASONE PROPIONATE 50 MCG/ACT NA SUSP: NASAL | 3 refills | Status: AC

## 2023-10-26 MED ORDER — OMEPRAZOLE 40 MG PO CPDR: 40.0000 mg | DELAYED_RELEASE_CAPSULE | Freq: Every day | ORAL | 3 refills | Status: DC

## 2023-10-27 ENCOUNTER — Other Ambulatory Visit: Payer: Self-pay | Admitting: Physician Assistant

## 2023-10-27 MED ORDER — OMEPRAZOLE 40 MG PO CPDR: 40.0000 mg | DELAYED_RELEASE_CAPSULE | Freq: Every day | ORAL | 3 refills | Status: AC

## 2023-10-27 MED ORDER — IBUPROFEN 800 MG PO TABS: 800.0000 mg | ORAL_TABLET | Freq: Three times a day (TID) | ORAL | 1 refills | Status: DC | PRN

## 2023-10-27 NOTE — Telephone Encounter (Signed)
 [facility] [place].

## 2023-10-27 NOTE — Telephone Encounter (Signed)
 Left message advising of medication.

## 2023-10-27 NOTE — Telephone Encounter (Signed)
 Copied from CRM [id number]. Topic: Clinical - Prescription Issue >> Oct 27, 2023  9:52 AM Zane F wrote: Reason for CRM:   Patient's wife, Elijah, contacted the office and stated that the patient's omeprazole  (PRILOSEC) 40 MG capsule prescription needs to be resubmitted to the [facility] listed [facility]. The patient's wife detailed that [place] did not properly refill the prescription but instead provided the patient with an alternative that caused him a great deal of discomfort (vomiting, sour stomach). Please contact the patient's wife when the prescription has been sent.   Callback Number: [id number]  Preferred Pharmacy:   [id number] [place] - [address] [place] [address] Phone: [phone] Fax: [phone] Hours: Not open 24 hours

## 2023-10-30 ENCOUNTER — Other Ambulatory Visit (HOSPITAL_COMMUNITY): Payer: Self-pay

## 2023-10-30 ENCOUNTER — Telehealth: Payer: Self-pay

## 2023-10-30 NOTE — Telephone Encounter (Signed)
 Pharmacy Patient Advocate Encounter  Received notification from [facility] that Prior Authorization for Omeprazole  40mg  caps has been DENIED.  See denial reason below. No denial letter attached in CMM. Will attach denial letter to Media tab once received.   PA #/Case ID/Reference #: [id number]

## 2023-10-30 NOTE — Telephone Encounter (Signed)
 Pharmacy Patient Advocate Encounter   Received notification from Patient Pharmacy that prior authorization for Omeprazole  40mg  caps is required/requested.   Insurance verification completed.   The patient is insured through [facility] .   Per test claim: PA required; PA submitted to above mentioned insurance via CoverMyMeds Key/confirmation #/EOC [id number] Status is pending

## 2023-11-01 NOTE — Telephone Encounter (Signed)
 Use good rx coupon for omeprazole  and not go through insurance.

## 2023-11-10 ENCOUNTER — Other Ambulatory Visit: Payer: Self-pay | Admitting: Physician Assistant

## 2023-11-10 DIAGNOSIS — I10 Essential (primary) hypertension: Secondary | ICD-10-CM

## 2023-12-05 ENCOUNTER — Other Ambulatory Visit: Payer: Self-pay | Admitting: Physician Assistant

## 2023-12-21 ENCOUNTER — Other Ambulatory Visit: Payer: Self-pay | Admitting: Physician Assistant

## 2023-12-21 DIAGNOSIS — M1712 Unilateral primary osteoarthritis, left knee: Secondary | ICD-10-CM

## 2023-12-24 ENCOUNTER — Other Ambulatory Visit: Payer: Self-pay | Admitting: Physician Assistant

## 2023-12-24 DIAGNOSIS — E7800 Pure hypercholesterolemia, unspecified: Secondary | ICD-10-CM

## 2024-01-13 ENCOUNTER — Other Ambulatory Visit: Payer: Self-pay | Admitting: Physician Assistant

## 2024-01-13 DIAGNOSIS — F331 Major depressive disorder, recurrent, moderate: Secondary | ICD-10-CM

## 2024-01-23 ENCOUNTER — Encounter: Payer: Self-pay | Admitting: Physician Assistant

## 2024-01-23 ENCOUNTER — Ambulatory Visit: Admitting: Physician Assistant

## 2024-01-23 VITALS — BP 144/88 | HR 83 | Ht 74.0 in

## 2024-01-23 DIAGNOSIS — Z23 Encounter for immunization: Secondary | ICD-10-CM

## 2024-01-23 DIAGNOSIS — M51362 Other intervertebral disc degeneration, lumbar region with discogenic back pain and lower extremity pain: Secondary | ICD-10-CM | POA: Diagnosis not present

## 2024-01-23 DIAGNOSIS — M48061 Spinal stenosis, lumbar region without neurogenic claudication: Secondary | ICD-10-CM

## 2024-01-23 NOTE — Progress Notes (Signed)
   Established Patient Office Visit  Subjective   Patient ID: Kenneth Duncan, male    DOB: 25-Dec-1955  Age: 68 y.o. MRN: [id number]  No chief complaint on file.   HPI Patient is a 68 year old male presenting to the clinic for a note declaring he can return to work. He has been receiving spinal injections for degenerative disc disease of the lumbar region. No back pain present today. He denies any radiation and other associated symptoms. He has had injections numerous times before but he had to be absent from work for 3 days and thus needs a return to work note.    Review of Systems  Musculoskeletal:  Negative for back pain, myalgias and neck pain.  All other systems reviewed and are negative.    Objective:     BP (!) 144/88   Pulse 83   Ht 6' 2 (1.88 m)   SpO2 98%   BMI 40.44 kg/m  BP Readings from Last 3 Encounters:  01/23/24 (!) 144/88  06/19/23 127/82  05/19/23 98/76   Wt Readings from Last 3 Encounters:  06/19/23 (!) 315 lb (142.9 kg)  05/19/23 (!) 305 lb (138.3 kg)  05/10/23 (!) 316 lb (143.3 kg)      Physical Exam Musculoskeletal:        General: No swelling, tenderness or deformity. Normal range of motion.     Cervical back: Normal and normal range of motion.     Thoracic back: Normal.     Lumbar back: Normal.       The 10-year ASCVD risk score (Arnett DK, et al., 2019) is: 20.2%    Assessment & Plan:  SABRASABRADiagnoses and all orders for this visit:  Lumbar foraminal stenosis  Degeneration of intervertebral disc of lumbar region with discogenic back pain and lower extremity pain  Need for influenza vaccination -     Flu vaccine HIGH DOSE PF(Fluzone Trivalent)   RTW written to go back tomorrow.  Follow up as needed Flu shot given today.    Destyn Schuyler, PA-C

## 2024-02-08 ENCOUNTER — Telehealth: Payer: Self-pay

## 2024-02-08 NOTE — Telephone Encounter (Signed)
 Called patient to inform of [employer] [facility] Paperwork is ready for pickup, LVM, thanks.

## 2024-02-21 ENCOUNTER — Ambulatory Visit: Payer: Self-pay

## 2024-02-21 NOTE — Telephone Encounter (Signed)
 Copied from CRM [id number]. Topic: Clinical - Prescription Issue >> Feb 21, 2024  4:27 PM Fonda T wrote: Reason for CRM: Patient spouse calling, Elijah, [place] verified, states patient needs a medication refill for blood pressure due to blood pressure issues.  Patient spouse states she is extremely confused as to which medication patient is to be taking, as there are 2 similar medications written for patient by 2 different providers.  Medication name: Olmesartan   Patient spouse would like a return call as soon as possible to discuss/advise further.   Called and spoke to [place] at front office, states she will have clinical staff return call to patient, states it will be between 24-48 hours.   Patient informed of above, verbalized understanding.  Can be reached at [phone]   [id number] [place] [place] - [address] [place] [address] Phone: [phone] Fax: [phone]

## 2024-02-21 NOTE — Telephone Encounter (Signed)
 Appears this was taken care of by the office. They sent a mychart message with the instructions. RN did attempt to call. LVM.    Copied from CRM #[id number]. Topic: Clinical - Prescription Issue >> Feb 21, 2024  4:51 PM Everette C wrote: Reason for CRM: The patient's wife would like to be contacted directly to discuss their medications and prescriptions when possible. The patient's wife would like to verify the medications and their readiness.

## 2024-02-21 NOTE — Telephone Encounter (Signed)
 This task has been completed as requested. Please review other TE for additional information.   Patient notified of the update. Per the patient's request, a MyChart message was sent regarding his current blood pressure regimen and dose. Verbalized understanding. No other inquiries during the call. No further action needed.

## 2024-02-22 ENCOUNTER — Telehealth: Payer: Self-pay

## 2024-02-22 NOTE — Telephone Encounter (Signed)
 LM for pt to return call with further questions. Medication dosage and directions sent through MyChart again as well.

## 2024-02-22 NOTE — Telephone Encounter (Signed)
 Copied from CRM [id number]. Topic: Clinical - Medication Question >> Feb 22, 2024  8:32 AM Willma R wrote: Reason for CRM: Patients wife state that she called yesterday to speak to someone in regards to her husbands medication. A return call was made to the patient and a MyChart message sent but Elijah states she wants a callback since she is the one who handles his medication. She wants to make sure he is taking the proper medication and doses.  Elijah can be reached at [phone]

## 2024-02-23 ENCOUNTER — Other Ambulatory Visit: Payer: Self-pay | Admitting: Physician Assistant

## 2024-02-23 DIAGNOSIS — K900 Celiac disease: Secondary | ICD-10-CM

## 2024-02-27 NOTE — Telephone Encounter (Signed)
 Attempted call to Joanne at given phone # - left a voice mail message requesting a return call.

## 2024-02-27 NOTE — Telephone Encounter (Signed)
 Wife requesting call back as she has not heard from office. Would like to discuss patient's medications.

## 2024-02-28 ENCOUNTER — Telehealth: Payer: Self-pay

## 2024-02-28 NOTE — Telephone Encounter (Signed)
 Copied from CRM [id number]. Topic: Clinical - Medication Question >> Feb 28, 2024  8:46 AM Victoria B wrote: Reason for CRM: patient's wife called in wants to know what the difference, between amlodipine  and amlodiine besolate, I let her know its one in the same but she still wants a cb and also wants to know what potassium chloride  is for. A messae left on 11/05 stateed patient shoud be taken this also

## 2024-02-28 NOTE — Telephone Encounter (Signed)
 Left message for a return call

## 2024-02-28 NOTE — Telephone Encounter (Signed)
 Left message with wife that I will try again later.

## 2024-02-28 NOTE — Telephone Encounter (Signed)
 Attempted call to Kenneth Duncan- left a voice mail message requesting a return call.

## 2024-03-01 NOTE — Telephone Encounter (Signed)
 Spoke with patient wife Kenneth Duncan.   She wanted to know Kenneth Duncan's thought regarding potassium listed  in med list that was prescribed by DR.Nasher. she was not happy with care given by DR. Nasher and is not sure that she should follow this recommendation- she wanted to know if Kenneth Duncan would recommend that the patient take potassium - was written in January 2025 by Dr. Calhoun but patient has not been taking any at all)   She also states the patient feels that the statin is causing s/e of bloating, headache and stomach upset. So Kenneth Duncan does not want to take this medication. Kenneth Duncan is still making the patient take the medication but Wanted to know Kenneth Duncan's thoughts on this as well.    Informed  that patient should be taking the Olmesartan  40mg  , Amlodipine  5mg  , and hydrochlorothiazide  12.5mg   for BP . ( Patient questioned whether amlodipine  besylate and Amlodipine  were the same medication ( I informed her that it was )

## 2024-03-01 NOTE — Telephone Encounter (Signed)
 This is duplicate message - addressed in separate message sent to provider.

## 2024-03-01 NOTE — Telephone Encounter (Signed)
 Potassium is on his med list but if not taking I would have him come in to get potassium level drawn so I can tell you if he needs or not.  I would stay on olmesartan , amlodipine , hydrochlorothiazide  for BP.  If he feels like having side effects from statin. Stop and then lets meet up in 2 weeks and see what next steps are.

## 2024-03-04 ENCOUNTER — Other Ambulatory Visit: Payer: Self-pay | Admitting: Physician Assistant

## 2024-03-19 ENCOUNTER — Other Ambulatory Visit (HOSPITAL_COMMUNITY): Payer: Self-pay

## 2024-03-19 ENCOUNTER — Telehealth: Payer: Self-pay

## 2024-03-19 NOTE — Telephone Encounter (Signed)
 Pharmacy Patient Advocate Encounter   Received notification from CoverMyMeds that prior authorization for Omeprazole  40mg  caps is required/requested.   Insurance verification completed.   The patient is insured through [facility].   Per test claim: Per test claim, medication is not covered due to plan/benefit exclusion, PA not submitted at this time     There is also a denial in the chart from 10/30/23 stating the insurance does not cover any drug that is therapeutically equivalent to an OTC drug.

## 2024-04-10 NOTE — Telephone Encounter (Unsigned)
 Copied from CRM [id number]. Topic: Clinical - Medication Refill >> Apr 10, 2024  7:50 AM Olam RAMAN wrote: Medication: amLODipine  (NORVASC ) 5 MG tablet REQ 90 DAYS  Has the patient contacted their pharmacy? Yes (Agent: If no, request that the patient contact the pharmacy for the refill. If patient does not wish to contact the pharmacy document the reason why and proceed with request.) (Agent: If yes, when and what did the pharmacy advise?)  This is the patient's preferred pharmacy:  [employer] - [facility] - [place], [place] - [address] [place] [address] Phone: [phone] Fax: [phone]  [id number] [place] [place] - [address] [place] [address] Phone: [phone] Fax: [phone]  [facility] - [place], [place] - [address] [place] [address] Phone: [phone] Fax: [phone]  Is this the correct pharmacy for this prescription? Yes If no, delete pharmacy and type the correct one.   Has the prescription been filled recently? Yes  Is the patient out of the medication? No  Has the patient been seen for an appointment in the last year OR does the patient have an upcoming appointment? Yes  Can we respond through MyChart? Yes  Agent: Please be advised that Rx refills may take up to 3 business days. We ask that you follow-up with your pharmacy.

## 2024-04-10 NOTE — Addendum Note (Signed)
 Addended by: GRETTA LAURAINE HERO on: 04/10/2024 09:46 AM   Modules accepted: Orders

## 2024-04-12 ENCOUNTER — Telehealth: Payer: Self-pay

## 2024-04-12 MED ORDER — AMLODIPINE BESYLATE 5 MG PO TABS: 5.0000 mg | ORAL_TABLET | Freq: Every day | ORAL | 1 refills | Status: DC

## 2024-04-12 NOTE — Addendum Note (Signed)
 Addended by: Donnalynn Wheeless P on: 04/12/2024 11:53 AM   Modules accepted: Orders

## 2024-04-12 NOTE — Telephone Encounter (Signed)
 Attempted call to patient to verify where he would like the prescription refill sent ? Three pharmacies listed as preferred on refill request message.   Left a voice mail message requesting a return call.

## 2024-04-12 NOTE — Telephone Encounter (Signed)
 Attempted call to both listed home and mobile # - left a voice mail message on both lines with return contact information requesting a return call.

## 2024-04-12 NOTE — Telephone Encounter (Signed)
 Reason for CRM: Patient's wife, Elijah, would like amLODipine  (NORVASC ) [facility]        Callback #: [phone]        [facility]    [phone] [place] - [address]    [address] [place] [address]    Phone: [phone] Fax: [phone]  Hours: Not open 24 hours

## 2024-04-12 NOTE — Telephone Encounter (Signed)
 Forwarding request to Kenneth Palin, NP covering Kenneth Duncan, Kenneth Duncan Requesting Amlodipine  5mg   Last written 03/04/2024 Last OV 01/23/2024 Message dated 02/21/2024 Kenneth Duncan had changed medication into individual components  Amlodipine  in original was 10mg   Bur refill was sent as 5mg   As below: As previously discussed on the phone, according to Kenneth Duncan notes on 05/19/23, he stated: We have discontinued the combination medication of Olmesartan , Amlodipine , Hydrochlorothiazide  tablet (40-10-25 mg).    Continue Olmesartan  40 mg a day, Amlodipine  5 mg a day, Hydrochlorothiazide  12.5 mg a day. Add potassium chloride  20 mill equivalents a day.   Just would like your review to be sure correct strength is being sent for refill.

## 2024-04-15 NOTE — Telephone Encounter (Signed)
 Prescription shows refilled in patient chart on 04/12/2024  Attempted call to patient before I knew of the above. Patient may still return my call.

## 2024-05-10 ENCOUNTER — Other Ambulatory Visit: Payer: Self-pay

## 2024-05-15 ENCOUNTER — Other Ambulatory Visit: Payer: Self-pay | Admitting: Physician Assistant

## 2024-05-17 NOTE — Telephone Encounter (Signed)
 In accordance with refill protocols, please review and address the following requirements before this medication refill can be authorized:  Labs
